# Patient Record
Sex: Female | Born: 1980 | Race: White | Hispanic: No | Marital: Married | Smoking: Former smoker
Health system: Southern US, Community
[De-identification: ages and names within clinical notes are randomized; demographics above are authoritative.]

## PROBLEM LIST (undated history)

## (undated) DIAGNOSIS — G4730 Sleep apnea, unspecified: Secondary | ICD-10-CM

## (undated) DIAGNOSIS — I10 Essential (primary) hypertension: Secondary | ICD-10-CM

## (undated) DIAGNOSIS — Z9889 Other specified postprocedural states: Secondary | ICD-10-CM

## (undated) DIAGNOSIS — R112 Nausea with vomiting, unspecified: Secondary | ICD-10-CM

## (undated) HISTORY — DX: Nausea with vomiting, unspecified: R11.2

## (undated) HISTORY — DX: Other specified postprocedural states: Z98.890

## (undated) HISTORY — PX: NO PAST SURGERIES: SHX2092

---
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
## 2001-08-24 ENCOUNTER — Emergency Department (HOSPITAL_COMMUNITY): Admission: EM | Admit: 2001-08-24 | Discharge: 2001-08-24 | Payer: Self-pay | Admitting: Emergency Medicine

## 2004-01-02 ENCOUNTER — Inpatient Hospital Stay (HOSPITAL_COMMUNITY): Admission: AD | Admit: 2004-01-02 | Discharge: 2004-01-02 | Payer: Self-pay | Admitting: Family Medicine

## 2009-05-21 ENCOUNTER — Emergency Department (HOSPITAL_COMMUNITY): Admission: EM | Admit: 2009-05-21 | Discharge: 2009-05-21 | Payer: Self-pay | Admitting: Emergency Medicine

## 2009-06-15 ENCOUNTER — Emergency Department (HOSPITAL_COMMUNITY): Admission: EM | Admit: 2009-06-15 | Discharge: 2009-06-15 | Payer: Self-pay | Admitting: Emergency Medicine

## 2009-10-25 ENCOUNTER — Emergency Department (HOSPITAL_COMMUNITY): Admission: EM | Admit: 2009-10-25 | Discharge: 2009-10-26 | Payer: Self-pay | Admitting: Emergency Medicine

## 2011-02-26 ENCOUNTER — Other Ambulatory Visit (HOSPITAL_COMMUNITY): Payer: Self-pay | Admitting: Interventional Radiology

## 2011-02-26 ENCOUNTER — Other Ambulatory Visit (HOSPITAL_COMMUNITY): Payer: Self-pay | Admitting: Internal Medicine

## 2011-09-13 ENCOUNTER — Inpatient Hospital Stay (INDEPENDENT_AMBULATORY_CARE_PROVIDER_SITE_OTHER)
Admission: RE | Admit: 2011-09-13 | Discharge: 2011-09-13 | Disposition: A | Discharge status: Home / Self Care | Payer: Self-pay | Source: Ambulatory Visit | Attending: Family Medicine | Admitting: Family Medicine

## 2011-09-13 DIAGNOSIS — R071 Chest pain on breathing: Secondary | ICD-10-CM

## 2014-04-22 ENCOUNTER — Encounter (HOSPITAL_COMMUNITY): Payer: Self-pay | Admitting: Emergency Medicine

## 2014-04-22 ENCOUNTER — Emergency Department (INDEPENDENT_AMBULATORY_CARE_PROVIDER_SITE_OTHER)
Admission: EM | Admit: 2014-04-22 | Discharge: 2014-04-22 | Disposition: A | Discharge status: Home / Self Care | Payer: Self-pay | Source: Home / Self Care | Attending: Family Medicine | Admitting: Family Medicine

## 2014-04-22 DIAGNOSIS — J309 Allergic rhinitis, unspecified: Secondary | ICD-10-CM

## 2014-04-22 DIAGNOSIS — R030 Elevated blood-pressure reading, without diagnosis of hypertension: Secondary | ICD-10-CM

## 2014-04-22 DIAGNOSIS — IMO0001 Reserved for inherently not codable concepts without codable children: Secondary | ICD-10-CM

## 2014-04-22 DIAGNOSIS — J301 Allergic rhinitis due to pollen: Secondary | ICD-10-CM

## 2014-04-22 MED ORDER — LORATADINE 10 MG PO TABS: 10.0000 mg | ORAL_TABLET | Freq: Every day | ORAL | Status: DC

## 2014-04-22 MED ORDER — IPRATROPIUM BROMIDE 0.06 % NA SOLN: 2.0000 | Freq: Four times a day (QID) | NASAL | Status: DC

## 2014-04-22 NOTE — ED Notes (Signed)
Pt reports  Symptoms  Of   sorethroat  Nasal  Congestion   /  Cough        As  Well  As  Headache     With  Symptoms  X    3  Days  -  She  Is  Sitting upright on  The  Exam table  Speaking in  Complete  sentances  In  No  Severe  Distress

## 2014-04-22 NOTE — ED Provider Notes (Signed)
CSN: [id number]     Arrival date & time 04/22/14  1512 History   First MD Initiated Contact with Patient 04/22/14 1617     Chief Complaint  Patient presents with  . URI   (Consider location/radiation/quality/duration/timing/severity/associated sxs/prior Treatment) HPI Comments: Nasal congestion x 3 weeks. Has been using Sinex nasal spray and states each time she stop using spray, nose becomes congested again. Also reports 3-4 days of cough and ear congestion without associated fever.  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   Patient is a 33 y.o. female presenting with URI.  URI Presenting symptoms: congestion, cough and rhinorrhea   Presenting symptoms: no ear pain and no sore throat   Associated symptoms: sneezing     History reviewed. No pertinent past medical history. History reviewed. No pertinent past surgical history. History reviewed. No pertinent family history. History  Substance Use Topics  . Smoking status: Current Every Day Smoker  . Smokeless tobacco: Not on file  . Alcohol Use: Yes   OB History   Grav Para Term Preterm Abortions TAB SAB Ect Mult Living                 Review of Systems  Constitutional: Negative.   HENT: Positive for congestion, postnasal drip, rhinorrhea, sinus pressure and sneezing. Negative for ear discharge, ear pain, mouth sores, nosebleeds, sore throat and trouble swallowing.   Eyes: Negative.   Respiratory: Positive for cough. Negative for chest tightness and shortness of breath.   Cardiovascular: Negative.   Gastrointestinal: Negative.   Genitourinary: Negative.   Musculoskeletal: Negative.   Skin: Negative.     Allergies  Review of patient's allergies indicates no known allergies.  Home Medications   Prior to Admission medications   Not on File   BP 122/103  Pulse 70  Temp(Src) 99.1 F (37.3 C) (Oral)  Resp 18  SpO2 100%  LMP 04/15/2014 Physical Exam  Nursing note and vitals  reviewed. Constitutional: She is oriented to person, place, and time. She appears well-developed and well-nourished. No distress.  HENT:  Head: Normocephalic and atraumatic.  Right Ear: Hearing, external ear and ear canal normal. A middle ear effusion is present.  Left Ear: Hearing, external ear and ear canal normal. A middle ear effusion is present.  Nose: Mucosal edema present.  Mouth/Throat: Uvula is midline, oropharynx is clear and moist and mucous membranes are normal. No oral lesions. No trismus in the jaw. No uvula swelling.  Trace of clear fluid behind each TM  Eyes: Conjunctivae are normal. Right eye exhibits no discharge. Left eye exhibits no discharge. No scleral icterus.  Neck: Normal range of motion. Neck supple.  Cardiovascular: Normal rate, regular rhythm and normal heart sounds.   Pulmonary/Chest: Effort normal and breath sounds normal. No respiratory distress. She has no wheezes.  Musculoskeletal: Normal range of motion.  Neurological: She is alert and oriented to person, place, and time.  Skin: Skin is warm and dry.  Psychiatric: She has a normal mood and affect. Her behavior is normal.    ED Course  Procedures (including critical care time) Labs Review Labs Reviewed - No data to display  Imaging Review No results found.   MDM   1. Elevated blood pressure   2. Hayfever    Will treat with Atrovent nasal spray and Claritin. Will advise patient to discontinue Sinex nasal spray. She will also need to locate PCP for blood pressure re-check in 7-10 days. Advised to discontinue smoking. Delsym  as prescribed on packaging for cough.    Jess BartersJennifer Lee Morgan[place] Resort[place], [place] 04/22/14 1655

## 2014-04-22 NOTE — Discharge Instructions (Signed)
Please discontinue using the Sinex nasal spray as it may be causing your blood pressure to be elevated. Use medications as prescribed and [facility].  Please try to stop smoking. Hay Fever Hay fever is an allergic reaction to particles in the air. It cannot be passed from person to person. It cannot be cured, but it can be controlled. CAUSES  Hay fever is caused by something that triggers an allergic reaction (allergens). The following are examples of allergens:  Ragweed.  Feathers.  Animal dander.  Grass and tree pollens.  Cigarette smoke.  House dust.  Pollution. SYMPTOMS   Sneezing.  Runny or stuffy nose.  Tearing eyes.  Itchy eyes, nose, mouth, throat, skin, or other area.  Sore throat.  Headache.  Decreased sense of smell or taste. DIAGNOSIS Your caregiver will perform a physical exam and ask questions about the symptoms you are having.Allergy testing may be done to determine exactly what triggers your hay fever.  TREATMENT   Over-the-counter medicines may help symptoms. These include:  Antihistamines.  Decongestants. These may help with nasal congestion.  Your caregiver may prescribe medicines if over-the-counter medicines do not work.  Some people benefit from allergy shots when other medicines are not helpful. HOME CARE INSTRUCTIONS   Avoid the allergen that is causing your symptoms, if possible.  Take all medicine as told by your caregiver. SEEK MEDICAL CARE IF:   You have severe allergy symptoms and your current medicines are not helping.  Your treatment was working at one time, but you are now experiencing symptoms.  You have sinus congestion and pressure.  You develop a fever or headache.  You have thick nasal discharge.  You have asthma and have a worsening cough and wheezing. SEEK IMMEDIATE MEDICAL CARE IF:   You have swelling of your tongue or lips.  You have trouble  breathing.  You feel lightheaded or like you are going to faint.  You have cold sweats.  You have a fever. Document Released: 11/26/2005 Document Revised: 02/18/2012 Document Reviewed: 02/21/2011 [facility] Patient Information 2014 [place], [place].  Hypertension As your heart beats, it forces blood through your arteries. This force is your blood pressure. If the pressure is too high, it is called hypertension (HTN) or high blood pressure. HTN is dangerous because you may have it and not know it. High blood pressure may mean that your heart has to work harder to pump blood. Your arteries may be narrow or stiff. The extra work puts you at risk for heart disease, stroke, and other problems.  Blood pressure consists of two numbers, a higher number over a lower, 110/72, for example. It is stated as "110 over 72." The ideal is below 120 for the top number (systolic) and under 80 for the bottom (diastolic). Write down your blood pressure today. You should pay close attention to your blood pressure if you have certain conditions such as:  Heart failure.  Prior heart attack.  Diabetes  Chronic kidney disease.  Prior stroke.  Multiple risk factors for heart disease. To see if you have HTN, your blood pressure should be measured while you are seated with your arm held at the level of the heart. It should be measured at least twice. A one-time elevated blood pressure reading (especially in the Emergency Department) does not mean that you need treatment. There may be conditions in which the blood pressure is different between your right and left arms. It is important  to see your caregiver soon for a recheck. Most people have essential hypertension which means that there is not a specific cause. This type of high blood pressure may be lowered by changing lifestyle factors such as:  Stress.  Smoking.  Lack of exercise.  Excessive weight.  Drug/tobacco/alcohol use.  Eating less salt. Most  people do not have symptoms from high blood pressure until it has caused damage to the body. Effective treatment can often prevent, delay or reduce that damage. TREATMENT  When a cause has been identified, treatment for high blood pressure is directed at the cause. There are a large number of medications to treat HTN. These fall into several categories, and your caregiver will help you select the medicines that are best for you. Medications may have side effects. You should review side effects with your caregiver. If your blood pressure stays high after you have made lifestyle changes or started on medicines,   Your medication(s) may need to be changed.  Other problems may need to be addressed.  Be certain you understand your prescriptions, and know how and when to take your medicine.  Be sure to follow up with your caregiver within the time frame advised (usually within two weeks) to have your blood pressure rechecked and to review your medications.  If you are taking more than one medicine to lower your blood pressure, make sure you know how and at what times they should be taken. Taking two medicines at the same time can result in blood pressure that is too low. SEEK IMMEDIATE MEDICAL CARE IF:  You develop a severe headache, blurred or changing vision, or confusion.  You have unusual weakness or numbness, or a faint feeling.  You have severe chest or abdominal pain, vomiting, or breathing problems. MAKE SURE YOU:   Understand these instructions.  Will watch your condition.  Will get help right away if you are not doing well or get worse. Document Released: 11/26/2005 Document Revised: 02/18/2012 Document Reviewed: 07/16/2008 [facility] Patient Information 2014 [place], [place].

## 2014-04-25 NOTE — ED Provider Notes (Signed)
Medical screening examination/treatment/procedure(s) were performed by a resident physician or non-physician practitioner and as the supervising physician I was immediately available for consultation/collaboration.  Destane Speas, MD    Ariq Khamis S Juandavid Dallman, MD 04/25/14 0844 

## 2015-12-11 NOTE — L&D Delivery Note (Signed)
Delivery Note Pt became complete at 1924 and pushed approx 20 mins and at 7:53 PM a viable female was delivered via Vaginal, Spontaneous Delivery (Presentation: ROA).  APGAR: 9, 9; weight: pending.  Cord clamped and cut by FOB after 60sec. Hospital cord blood sample collected. Placenta status: spont,intact.  Cord: 3 vessel  Anesthesia:  epidural Episiotomy: None Lacerations: None Est. Blood Loss (mL): 150  Mom to postpartum.  Baby to Couplet care / Skin to Skin.  Cam HaiSHAW, KIMBERLY CNM 11/12/2016, 8:57 PM

## 2016-05-03 ENCOUNTER — Encounter: Payer: Self-pay | Admitting: Certified Nurse Midwife

## 2016-05-03 ENCOUNTER — Ambulatory Visit (INDEPENDENT_AMBULATORY_CARE_PROVIDER_SITE_OTHER): Payer: Medicaid Other | Admitting: Certified Nurse Midwife

## 2016-05-03 VITALS — BP 134/85 | HR 57 | Wt 165.0 lb

## 2016-05-03 DIAGNOSIS — Z3401 Encounter for supervision of normal first pregnancy, first trimester: Secondary | ICD-10-CM

## 2016-05-03 DIAGNOSIS — O219 Vomiting of pregnancy, unspecified: Secondary | ICD-10-CM

## 2016-05-03 DIAGNOSIS — Z1389 Encounter for screening for other disorder: Secondary | ICD-10-CM

## 2016-05-03 DIAGNOSIS — Z331 Pregnant state, incidental: Secondary | ICD-10-CM

## 2016-05-03 DIAGNOSIS — Z3687 Encounter for antenatal screening for uncertain dates: Secondary | ICD-10-CM

## 2016-05-03 DIAGNOSIS — Z3492 Encounter for supervision of normal pregnancy, unspecified, second trimester: Secondary | ICD-10-CM | POA: Diagnosis not present

## 2016-05-03 DIAGNOSIS — Z3481 Encounter for supervision of other normal pregnancy, first trimester: Secondary | ICD-10-CM

## 2016-05-03 DIAGNOSIS — O0990 Supervision of high risk pregnancy, unspecified, unspecified trimester: Secondary | ICD-10-CM | POA: Insufficient documentation

## 2016-05-03 LAB — POCT URINALYSIS DIPSTICK
Bilirubin, UA: NEGATIVE
Blood, UA: NEGATIVE
Clarity, UA: NEGATIVE
Glucose, UA: NEGATIVE
Ketones, UA: NEGATIVE
Leukocytes, UA: NEGATIVE
Nitrite, UA: NEGATIVE
Specific Gravity, UA: 1.015
Urobilinogen, UA: NEGATIVE
pH, UA: 5

## 2016-05-03 MED ORDER — VITAFOL GUMMIES 3.33-0.333-34.8 MG PO CHEW: 3.0000 | CHEWABLE_TABLET | Freq: Every day | ORAL | Status: DC

## 2016-05-03 MED ORDER — DOXYLAMINE-PYRIDOXINE 10-10 MG PO TBEC: DELAYED_RELEASE_TABLET | ORAL | Status: DC

## 2016-05-03 NOTE — Progress Notes (Signed)
Subjective:    Shelley Rojas is being seen today for her first obstetrical visit.  This is not a planned pregnancy. She is at [redacted]w[redacted]d gestation. Her obstetrical history is significant for smoker and 6-7 cig/day. Relationship with FOB: spouse, living together. Patient does not intend to breast feed. Pregnancy history fully reviewed.  The information documented in the HPI was reviewed and verified.  Menstrual History: OB History    Gravida Para Term Preterm AB TAB SAB Ectopic Multiple Living   1               Menarche age: 35 years of age.    Patient's last menstrual period was 02/17/2016 (approximate).    No past medical history on file.  No past surgical history on file.   (Not in a hospital admission) No Known Allergies  Social History  Substance Use Topics  . Smoking status: Current Every Day Smoker  . Smokeless tobacco: Not on file  . Alcohol Use: Yes    No family history on file.   Review of Systems Constitutional: negative for weight loss Gastrointestinal: + for nausea & vomiting Genitourinary:negative for genital lesions and vaginal discharge and dysuria Musculoskeletal:negative for back pain Behavioral/Psych: negative for abusive relationship, depression, illegal drug usage and tobacco use    Objective:    BP 134/85 mmHg  Pulse 57  Wt 165 lb (74.844 kg)  LMP 02/17/2016 (Approximate) General Appearance:    Alert, cooperative, no distress, appears stated age  Head:    Normocephalic, without obvious abnormality, atraumatic  Eyes:    PERRL, conjunctiva/corneas clear, EOM's intact, fundi    benign, both eyes  Ears:    Normal TM's and external ear canals, both ears  Nose:   Nares normal, septum midline, mucosa normal, no drainage    or sinus tenderness  Throat:   Lips, mucosa, and tongue normal; teeth and gums normal  Neck:   Supple, symmetrical, trachea midline, no adenopathy;    thyroid:  no enlargement/tenderness/nodules; no carotid   bruit or JVD  Back:      Symmetric, no curvature, ROM normal, no CVA tenderness  Lungs:     Clear to auscultation bilaterally, respirations unlabored  Chest Wall:    No tenderness or deformity   Heart:    Regular rate and rhythm, S1 and S2 normal, no murmur, rub   or gallop  Breast Exam:    No tenderness, masses, or nipple abnormality  Abdomen:     Soft, non-tender, bowel sounds active all four quadrants,    no masses, no organomegaly  Genitalia:    Normal female without lesion, discharge or tenderness  Extremities:   Extremities normal, atraumatic, no cyanosis or edema  Pulses:   2+ and symmetric all extremities  Skin:   Skin color, texture, turgor normal, no rashes or lesions  Lymph nodes:   Cervical, supraclavicular, and axillary nodes normal  Neurologic:   CNII-XII intact, normal strength, sensation and reflexes    throughout      Lab Review Urine pregnancy test Labs reviewed yes Radiologic studies reviewed no Assessment:    Pregnancy at [redacted]w[redacted]d weeks   N&V in early pregnancy   Unsure of LMP  Plan:      Prenatal vitamins.  Counseling provided regarding continued use of seat belts, cessation of alcohol consumption, smoking or use of illicit drugs; infection precautions i.e., influenza/TDAP immunizations, toxoplasmosis,CMV, parvovirus, listeria and varicella; workplace safety, exercise during pregnancy; routine dental care, safe medications, sexual activity, hot tubs, saunas,  pools, travel, caffeine use, fish and methlymercury, potential toxins, hair treatments, varicose veins Weight gain recommendations per IOM guidelines reviewed: underweight/BMI< 18.5--> gain 28 - 40 lbs; normal weight/BMI 18.5 - 24.9--> gain 25 - 35 lbs; overweight/BMI 25 - 29.9--> gain 15 - 25 lbs; obese/BMI >30->gain  11 - 20 lbs Problem list reviewed and updated. FIRST/CF mutation testing/NIPT/QUAD SCREEN/fragile X/Ashkenazi Jewish population testing/Spinal muscular atrophy discussed: ordered. Role of ultrasound in pregnancy  discussed; fetal survey: requested. Amniocentesis discussed: not indicated. VBAC calculator score: VBAC consent form provided Meds ordered this encounter  Medications  . Doxylamine-Pyridoxine (DICLEGIS) 10-10 MG TBEC    Sig: Take 1 tablet with breakfast and lunch.  Take 2 tablets at bedtime.    Dispense:  100 tablet    Refill:  4  . Prenatal Vit-Fe Phos-FA-Omega (VITAFOL GUMMIES) 3.33-0.333-34.8 MG CHEW    Sig: Chew 3 tablets by mouth daily.    Dispense:  90 tablet    Refill:  12   Orders Placed This Encounter  Procedures  . Culture, OB Urine  . US OB Comp Less 14 Wks    Standing Status: Future     Number of Occurrences:      Standing Expiration Date: 07/03/2017    Order Specific Question:  Reason for Exam (SYMPTOM  OR DIAGNOSIS REQUIRED)    Answer:  unsure of lmp, viability    Order Specific Question:  Preferred imaging location?    Answer:  Internal  . HIV antibody  . Hemoglobinopathy evaluation  . Varicella zoster antibody, IgG  . VITAMIN D 25 Hydroxy (Vit-D Deficiency, Fractures)  . Prenatal Profile I  . MaterniT21 PLUS Core+SCA    Order Specific Question:  Is the patient insulin dependent?    Answer:  No    Order Specific Question:  Please enter gestational age. This should be expressed as weeks AND days, i.e. 16w 6d. Enter weeks here. Enter days in next question.    Answer:  6110    Order Specific Question:  Please enter gestational age. This should be expressed as weeks AND days, i.e. 16w 6d. Enter days here. Enter weeks in previous question.    Answer:  6    Order Specific Question:  How was gestational age calculated?    Answer:  LMP    Order Specific Question:  Please give the date of LMP OR Ultrasound OR Estimated date of delivery.    Answer:  02/17/2016    Order Specific Question:  Number of Fetuses (Type of Pregnancy):    Answer:  1    Order Specific Question:  Indications for performing the test? (please choose all that apply):    Answer:  Routine screening     Order Specific Question:  Other Indications? (Y=Yes, N=No)    Answer:  N    Order Specific Question:  If this is a repeat specimen, please indicate the reason:    Answer:  Not indicated    Order Specific Question:  Please specify the patient's race: (C=White/Caucasion, B=Black, I=Native American, A=Asian, H=Hispanic, O=Other, U=Unknown)    Answer:  U    Order Specific Question:  Donor Egg - indicate if the egg was obtained from in vitro fertilization.    Answer:  N    Order Specific Question:  Age of Egg Donor.    Answer:  0    Order Specific Question:  Prior Down Syndrome/ONTD screening during current pregnancy.    Answer:  N    Order Specific Question:  Prior First  Trimester Testing    Answer:  N    Order Specific Question:  Prior Second Trimester Testing    Answer:  N    Order Specific Question:  Family History of Neural Tube Defects    Answer:  N    Order Specific Question:  Prior Pregnancy with Down Syndrome    Answer:  N    Order Specific Question:  Please give the patient's weight (in pounds)    Answer:  165  . POCT Urinalysis Dipstick    Follow up in 4 weeks. 50% of 30 min visit spent on counseling and coordination of care.

## 2016-05-07 LAB — NUSWAB VG+, CANDIDA 6SP
ATOPOBIUM VAGINAE: HIGH {score} — AB
BVAB 2: HIGH Score — AB
CANDIDA ALBICANS, NAA: NEGATIVE
CANDIDA GLABRATA, NAA: NEGATIVE
CANDIDA LUSITANIAE, NAA: NEGATIVE
CANDIDA PARAPSILOSIS, NAA: NEGATIVE
CHLAMYDIA TRACHOMATIS, NAA: NEGATIVE
Candida krusei, NAA: NEGATIVE
Candida tropicalis, NAA: NEGATIVE
Megasphaera 1: HIGH Score — AB
NEISSERIA GONORRHOEAE, NAA: NEGATIVE
Trich vag by NAA: NEGATIVE

## 2016-05-08 ENCOUNTER — Other Ambulatory Visit: Payer: Self-pay | Admitting: Certified Nurse Midwife

## 2016-05-08 DIAGNOSIS — B9689 Other specified bacterial agents as the cause of diseases classified elsewhere: Secondary | ICD-10-CM

## 2016-05-08 DIAGNOSIS — N760 Acute vaginitis: Principal | ICD-10-CM

## 2016-05-08 MED ORDER — METRONIDAZOLE 0.75 % VA GEL: 1.0000 | Freq: Two times a day (BID) | VAGINAL | Status: DC

## 2016-05-09 ENCOUNTER — Other Ambulatory Visit: Payer: Self-pay | Admitting: Certified Nurse Midwife

## 2016-05-09 LAB — PAP IG AND HPV HIGH-RISK
HPV, high-risk: NEGATIVE
PAP Smear Comment: 0

## 2016-05-09 LAB — CULTURE, OB URINE

## 2016-05-09 LAB — URINE CULTURE, OB REFLEX

## 2016-05-10 ENCOUNTER — Other Ambulatory Visit: Payer: Self-pay | Admitting: Certified Nurse Midwife

## 2016-05-10 ENCOUNTER — Encounter: Payer: Self-pay | Admitting: *Deleted

## 2016-05-10 ENCOUNTER — Ambulatory Visit (INDEPENDENT_AMBULATORY_CARE_PROVIDER_SITE_OTHER): Payer: Medicaid Other

## 2016-05-10 DIAGNOSIS — T7840XS Allergy, unspecified, sequela: Secondary | ICD-10-CM

## 2016-05-10 DIAGNOSIS — O3680X1 Pregnancy with inconclusive fetal viability, fetus 1: Secondary | ICD-10-CM | POA: Diagnosis not present

## 2016-05-10 DIAGNOSIS — Z3687 Encounter for antenatal screening for uncertain dates: Secondary | ICD-10-CM

## 2016-05-10 LAB — MATERNIT21 PLUS CORE+SCA
CHROMOSOME 13: NEGATIVE
CHROMOSOME 21: NEGATIVE
Chromosome 18: NEGATIVE
PDF: 0
Y Chromosome: NOT DETECTED

## 2016-05-10 LAB — PRENATAL PROFILE I(LABCORP)
ANTIBODY SCREEN: NEGATIVE
BASOS: 0 %
Basophils Absolute: 0 10*3/uL (ref 0.0–0.2)
EOS (ABSOLUTE): 0.1 10*3/uL (ref 0.0–0.4)
Eos: 1 %
HEMATOCRIT: 40.3 % (ref 34.0–46.6)
HEMOGLOBIN: 13.6 g/dL (ref 11.1–15.9)
Hepatitis B Surface Ag: NEGATIVE
Immature Grans (Abs): 0 10*3/uL (ref 0.0–0.1)
Immature Granulocytes: 0 %
LYMPHS ABS: 1 10*3/uL (ref 0.7–3.1)
Lymphs: 19 %
MCH: 30.4 pg (ref 26.6–33.0)
MCHC: 33.7 g/dL (ref 31.5–35.7)
MCV: 90 fL (ref 79–97)
MONOS ABS: 0.3 10*3/uL (ref 0.1–0.9)
Monocytes: 6 %
NEUTROS ABS: 4 10*3/uL (ref 1.4–7.0)
Neutrophils: 74 %
Platelets: 215 10*3/uL (ref 150–379)
RBC: 4.47 x10E6/uL (ref 3.77–5.28)
RDW: 14.1 % (ref 12.3–15.4)
RPR: NONREACTIVE
Rh Factor: POSITIVE
WBC: 5.4 10*3/uL (ref 3.4–10.8)

## 2016-05-10 LAB — HEMOGLOBINOPATHY EVALUATION
HEMOGLOBIN F QUANTITATION: 0 % (ref 0.0–2.0)
HGB A: 97.7 % (ref 94.0–98.0)
HGB C: 0 %
HGB S: 0 %
Hemoglobin A2 Quantitation: 2.3 % (ref 0.7–3.1)

## 2016-05-10 LAB — HIV ANTIBODY (ROUTINE TESTING W REFLEX): HIV SCREEN 4TH GENERATION: NONREACTIVE

## 2016-05-10 LAB — VITAMIN D 25 HYDROXY (VIT D DEFICIENCY, FRACTURES): Vit D, 25-Hydroxy: 23.5 ng/mL — ABNORMAL LOW (ref 30.0–100.0)

## 2016-05-10 LAB — VARICELLA ZOSTER ANTIBODY, IGG: Varicella zoster IgG: 1013 index (ref >165)

## 2016-05-10 MED ORDER — LORATADINE 10 MG PO TABS: 10.0000 mg | ORAL_TABLET | Freq: Every day | ORAL | Status: DC

## 2016-05-10 MED ORDER — FLUTICASONE PROPIONATE 50 MCG/ACT NA SUSP: 2.0000 | Freq: Every day | NASAL | Status: DC

## 2016-06-01 ENCOUNTER — Encounter: Payer: Medicaid Other | Admitting: Certified Nurse Midwife

## 2016-06-06 ENCOUNTER — Ambulatory Visit (INDEPENDENT_AMBULATORY_CARE_PROVIDER_SITE_OTHER): Payer: Medicaid Other | Admitting: Certified Nurse Midwife

## 2016-06-06 ENCOUNTER — Encounter: Payer: Self-pay | Admitting: Certified Nurse Midwife

## 2016-06-06 VITALS — BP 135/93 | HR 81 | Wt 166.0 lb

## 2016-06-06 DIAGNOSIS — O132 Gestational [pregnancy-induced] hypertension without significant proteinuria, second trimester: Secondary | ICD-10-CM

## 2016-06-06 DIAGNOSIS — Z331 Pregnant state, incidental: Secondary | ICD-10-CM | POA: Diagnosis not present

## 2016-06-06 DIAGNOSIS — Z1389 Encounter for screening for other disorder: Secondary | ICD-10-CM | POA: Diagnosis not present

## 2016-06-06 DIAGNOSIS — Z3482 Encounter for supervision of other normal pregnancy, second trimester: Secondary | ICD-10-CM | POA: Diagnosis not present

## 2016-06-06 DIAGNOSIS — O162 Unspecified maternal hypertension, second trimester: Secondary | ICD-10-CM

## 2016-06-06 LAB — POCT URINALYSIS DIPSTICK
BILIRUBIN UA: NEGATIVE
GLUCOSE UA: NEGATIVE
KETONES UA: NEGATIVE
Leukocytes, UA: NEGATIVE
Nitrite, UA: NEGATIVE
Protein, UA: NEGATIVE
RBC UA: NEGATIVE
UROBILINOGEN UA: NEGATIVE
pH, UA: 6.5

## 2016-06-06 MED ORDER — LABETALOL HCL 200 MG PO TABS: 200.0000 mg | ORAL_TABLET | Freq: Two times a day (BID) | ORAL | Status: DC

## 2016-06-06 MED ORDER — ASPIRIN 81 MG PO CHEW: 81.0000 mg | CHEWABLE_TABLET | Freq: Every day | ORAL | Status: DC

## 2016-06-06 NOTE — Progress Notes (Signed)
  Subjective:    Shelley Rojas is a 35 y.o. female being seen today for her obstetrical visit. She is at 7141w5d gestation. Patient reports: nausea, no bleeding, no contractions, no cramping, no leaking and vomiting.  Problem List Items Addressed This Visit    None     Patient Active Problem List   Diagnosis Date Noted  . Supervision of normal first pregnancy in first trimester 05/03/2016    Objective:     BP 135/93 mmHg  Pulse 81  Wt 166 lb (75.297 kg)  LMP 02/17/2016 (Approximate) Uterine Size: Below umbilicus   FHR: 150 by doppler  Assessment:    Pregnancy @ 7141w5d  weeks N&V in early pregnancy     Chronic HTN Plan:    Problem list reviewed and updated. Labs reviewed.  Follow up in 4 weeks. FIRST/CF mutation testing/NIPT/QUAD SCREEN/fragile X/Ashkenazi Jewish population testing/Spinal muscular atrophy discussed: results reviewed. Role of ultrasound in pregnancy discussed; fetal survey: ordered. Amniocentesis discussed: not indicated. 50% of 25 minute visit spent on counseling and coordination of care.

## 2016-06-07 LAB — CREATININE, SERUM
CREATININE: 0.61 mg/dL (ref 0.57–1.00)
GFR calc Af Amer: 138 mL/min/{1.73_m2} (ref >59)
GFR, EST NON AFRICAN AMERICAN: 119 mL/min/{1.73_m2} (ref >59)

## 2016-06-07 LAB — PROTEIN / CREATININE RATIO, URINE
CREATININE, UR: 13.1 mg/dL
PROTEIN UR: 4.6 mg/dL
Protein/Creat Ratio: 351 mg/g creat — ABNORMAL HIGH (ref 0–200)

## 2016-06-07 LAB — CBC
HEMOGLOBIN: 12.2 g/dL (ref 11.1–15.9)
Hematocrit: 34.8 % (ref 34.0–46.6)
MCH: 31.3 pg (ref 26.6–33.0)
MCHC: 35.1 g/dL (ref 31.5–35.7)
MCV: 89 fL (ref 79–97)
PLATELETS: 192 10*3/uL (ref 150–379)
RBC: 3.9 x10E6/uL (ref 3.77–5.28)
RDW: 14 % (ref 12.3–15.4)
WBC: 6.6 10*3/uL (ref 3.4–10.8)

## 2016-06-07 LAB — LACTATE DEHYDROGENASE: LDH: 143 IU/L (ref 119–226)

## 2016-06-07 LAB — AST: AST: 13 IU/L (ref 0–40)

## 2016-06-07 LAB — ALT: ALT: 10 IU/L (ref 0–32)

## 2016-06-13 LAB — PATHOLOGIST SMEAR REVIEW
PATH REV PLTS: NORMAL
Path Rev RBC: NORMAL
Path Rev WBC: NORMAL

## 2016-06-29 ENCOUNTER — Encounter (HOSPITAL_COMMUNITY): Payer: Self-pay | Admitting: Certified Nurse Midwife

## 2016-07-06 ENCOUNTER — Other Ambulatory Visit: Payer: Self-pay | Admitting: Certified Nurse Midwife

## 2016-07-06 ENCOUNTER — Ambulatory Visit (INDEPENDENT_AMBULATORY_CARE_PROVIDER_SITE_OTHER): Payer: Medicaid Other | Admitting: Certified Nurse Midwife

## 2016-07-06 ENCOUNTER — Encounter (HOSPITAL_COMMUNITY): Payer: Self-pay

## 2016-07-06 ENCOUNTER — Ambulatory Visit (HOSPITAL_COMMUNITY)
Admission: RE | Admit: 2016-07-06 | Discharge: 2016-07-06 | Disposition: A | Discharge status: Home / Self Care | Payer: Medicaid Other | Source: Ambulatory Visit | Attending: Certified Nurse Midwife | Admitting: Certified Nurse Midwife

## 2016-07-06 VITALS — BP 123/87 | HR 76 | Wt 170.0 lb

## 2016-07-06 DIAGNOSIS — Z36 Encounter for antenatal screening of mother: Secondary | ICD-10-CM | POA: Diagnosis not present

## 2016-07-06 DIAGNOSIS — Z331 Pregnant state, incidental: Secondary | ICD-10-CM | POA: Diagnosis not present

## 2016-07-06 DIAGNOSIS — Z1389 Encounter for screening for other disorder: Secondary | ICD-10-CM | POA: Diagnosis not present

## 2016-07-06 DIAGNOSIS — O0992 Supervision of high risk pregnancy, unspecified, second trimester: Secondary | ICD-10-CM

## 2016-07-06 DIAGNOSIS — Z3A20 20 weeks gestation of pregnancy: Secondary | ICD-10-CM | POA: Insufficient documentation

## 2016-07-06 DIAGNOSIS — O162 Unspecified maternal hypertension, second trimester: Secondary | ICD-10-CM

## 2016-07-06 DIAGNOSIS — O10012 Pre-existing essential hypertension complicating pregnancy, second trimester: Secondary | ICD-10-CM | POA: Diagnosis present

## 2016-07-06 DIAGNOSIS — Z3689 Encounter for other specified antenatal screening: Secondary | ICD-10-CM

## 2016-07-06 DIAGNOSIS — Z3482 Encounter for supervision of other normal pregnancy, second trimester: Secondary | ICD-10-CM

## 2016-07-06 DIAGNOSIS — O10912 Unspecified pre-existing hypertension complicating pregnancy, second trimester: Secondary | ICD-10-CM

## 2016-07-06 LAB — POCT URINALYSIS DIPSTICK
BILIRUBIN UA: NEGATIVE
Blood, UA: NEGATIVE
GLUCOSE UA: NEGATIVE
Ketones, UA: NEGATIVE
Leukocytes, UA: NEGATIVE
NITRITE UA: NEGATIVE
Protein, UA: NEGATIVE
Spec Grav, UA: <=1.005
UROBILINOGEN UA: NEGATIVE
pH, UA: 7

## 2016-07-06 NOTE — Progress Notes (Signed)
MFM consult, staff note  During our discussion, I reviewed hypertension as a cause of uteroplacental insufficiency, with increased risk of IUGR, oligohydramnios, and stillbirth. I told her that her hypertension also places her at increased risk for preeclampsia, describing the triad of increased blood pressure, proteinuria, and abnormal edema. Lastly, hypertension (severe range) increases the risk of placental abruption, especially in the setting of superimposed preeclampsia.  I reviewed the essential tenets in the most recent guidelines for management of hypertension in pregnancy in accordance with the American College of Obstetrics and Gynecology expert opinion. We talked about the medical treatment of hypertension in pregnancy. I outlined the different classes of medications, emphasizing that angiotensin enzyme inhibitors and angoitensin receptor blockers are contraindicated, and diuretics are relatively contraindicated. I told her that beta-blockers and calcium channel blockers are commonly used to treat hypertension in pregnancy, and that both are felt to be safe for use in pregnancy.   Her dose of labetalol is adequate in current maintainance of her blood pressure in pregnancy as evidenced by normotensive BP today.   I outlined the usual plan of management for hypertension in pregnancy. She should have her blood pressure carefully followed, and her medications adjusted to keep her BP in the target range of around 130-159/70-109 mm/Hg; ie, HTN should not be treated (no medication adjustment) until 160/110 or greater measurements for blood pressures to be consistent with current ACOG/SMFM guidelines (ie, guidelines are 160/110 in absence of renal/cardiac disease during pregnancy).   Recommendations:  1. Interval growth ultrasounds monthly 2. Antenatal testing to begin at 32 weeks 3. Initiate aspirin 81 mg po qd and continue until 36 weeks to prevent preeclampsia/IUGR in setting of CHTN 4. Delivery  at 38-39 weeks.  Time Spent: I spent in excess of 30 minutes in consultation with this patient to review records, evaluate her case, and provide her with an adequate discussion and education.  More than 50% of this time was spent in direct face-to-face counseling and evaluation.  It was a pleasure seeing your patient in the office today.  Thank you for consultation. Please do not hesitate to contact our service for any further questions.   Thank you,  Louann Sjogren Gaynelle Arabian, Louann Sjogren, MD, MS, FACOG Assistant Professor Section of Maternal-Fetal Medicine [facility]

## 2016-07-06 NOTE — Progress Notes (Signed)
Subjective:    Shelley Rojas is a 35 y.o. female being seen today for her obstetrical visit. She is at [redacted]w[redacted]d gestation. Patient reports: backache, no bleeding, no contractions, no cramping and no leaking . Fetal movement: normal.  Problem List Items Addressed This Visit    None    Visit Diagnoses    Encounter for supervision of other normal pregnancy in second trimester    -  Primary   Relevant Orders   POCT urinalysis dipstick (Completed)     Patient Active Problem List   Diagnosis Date Noted  . Supervision of normal first pregnancy in first trimester 05/03/2016   Objective:    BP 123/87   Pulse 76   Wt 170 lb (77.1 kg)   LMP 02/17/2016 (Approximate)  FHT: 150 BPM  Uterine Size: size equals dates and at U     Assessment:    Pregnancy @ [redacted]w[redacted]d     Plan:    OBGCT: discussed. Signs and symptoms of preterm labor: discussed.  Labs, problem list reviewed and updated 2 hr GTT planned Follow up in 4 weeks.

## 2016-07-27 ENCOUNTER — Telehealth: Payer: Self-pay | Admitting: Certified Nurse Midwife

## 2016-07-27 NOTE — Telephone Encounter (Signed)
Pt stated she needs a letter stating that she cannot lift a certain amount of pounds. Please advise

## 2016-07-30 ENCOUNTER — Encounter: Payer: Self-pay | Admitting: *Deleted

## 2016-08-03 ENCOUNTER — Ambulatory Visit (INDEPENDENT_AMBULATORY_CARE_PROVIDER_SITE_OTHER): Payer: Medicaid Other | Admitting: Certified Nurse Midwife

## 2016-08-03 ENCOUNTER — Ambulatory Visit (HOSPITAL_COMMUNITY)
Admission: RE | Admit: 2016-08-03 | Discharge: 2016-08-03 | Disposition: A | Discharge status: Home / Self Care | Payer: Medicaid Other | Source: Ambulatory Visit | Attending: Certified Nurse Midwife | Admitting: Certified Nurse Midwife

## 2016-08-03 ENCOUNTER — Encounter: Payer: Self-pay | Admitting: Certified Nurse Midwife

## 2016-08-03 ENCOUNTER — Other Ambulatory Visit (HOSPITAL_COMMUNITY): Payer: Self-pay | Admitting: Obstetrics and Gynecology

## 2016-08-03 ENCOUNTER — Encounter (HOSPITAL_COMMUNITY): Payer: Self-pay

## 2016-08-03 VITALS — BP 121/85 | HR 85 | Temp 97.9°F | Wt 174.7 lb

## 2016-08-03 DIAGNOSIS — O162 Unspecified maternal hypertension, second trimester: Secondary | ICD-10-CM

## 2016-08-03 DIAGNOSIS — Z1389 Encounter for screening for other disorder: Secondary | ICD-10-CM

## 2016-08-03 DIAGNOSIS — Z3A24 24 weeks gestation of pregnancy: Secondary | ICD-10-CM | POA: Insufficient documentation

## 2016-08-03 DIAGNOSIS — Z3492 Encounter for supervision of normal pregnancy, unspecified, second trimester: Secondary | ICD-10-CM | POA: Diagnosis not present

## 2016-08-03 DIAGNOSIS — Z0489 Encounter for examination and observation for other specified reasons: Secondary | ICD-10-CM

## 2016-08-03 DIAGNOSIS — Z36 Encounter for antenatal screening of mother: Secondary | ICD-10-CM | POA: Insufficient documentation

## 2016-08-03 DIAGNOSIS — IMO0002 Reserved for concepts with insufficient information to code with codable children: Secondary | ICD-10-CM

## 2016-08-03 DIAGNOSIS — Z331 Pregnant state, incidental: Secondary | ICD-10-CM

## 2016-08-03 DIAGNOSIS — Z3482 Encounter for supervision of other normal pregnancy, second trimester: Secondary | ICD-10-CM

## 2016-08-03 LAB — POCT URINALYSIS DIPSTICK
Bilirubin, UA: NEGATIVE
Glucose, UA: NEGATIVE
KETONES UA: NEGATIVE
Leukocytes, UA: NEGATIVE
Nitrite, UA: NEGATIVE
PH UA: 7.5
PROTEIN UA: NEGATIVE
RBC UA: NEGATIVE
SPEC GRAV UA: 1.015
UROBILINOGEN UA: 0.2

## 2016-08-03 NOTE — Progress Notes (Signed)
Subjective:    Shelley BasemanHeather M Rojas is a 35 y.o. female being seen today for her obstetrical visit. She is at 6038w0d gestation. Patient reports: no complaints and was worried about breech positioning of fetus, encouragement given . Fetal movement: normal.  Problem List Items Addressed This Visit      Other   Encounter for supervision of other normal pregnancy in second trimester    Other Visit Diagnoses    Prenatal care, second trimester    -  Primary   Relevant Orders   POCT Urinalysis Dipstick (Completed)     Patient Active Problem List   Diagnosis Date Noted  . Encounter for supervision of other normal pregnancy in second trimester 05/03/2016   Objective:    BP 121/85   Pulse 85   Temp 97.9 F (36.6 C)   Wt 174 lb 11.2 oz (79.2 kg)   LMP 02/17/2016 (Approximate)  FHT: 140 BPM  Uterine Size: 24 cm and size equals dates     Assessment:    Pregnancy @ 2138w0d    Breech position of fetus  Planned IOL @38 -39 wks  NSTs @32  weeks for CHTN  Rubella non-immune  GBS+ urine status Plan:    OBGCT: discussed and ordered for next visit. Signs and symptoms of preterm labor: discussed.  Labs, problem list reviewed and updated 2 hr GTT planned Follow up in 3 weeks.

## 2016-08-03 NOTE — Progress Notes (Signed)
Patient has concerns about breach fetus.

## 2016-08-03 NOTE — Patient Instructions (Addendum)
Second Trimester of Pregnancy The second trimester is from week 13 through week 28, months 4 through 6. The second trimester is often a time when you feel your best. Your body has also adjusted to being pregnant, and you begin to feel better physically. Usually, morning sickness has lessened or quit completely, you may have more energy, and you may have an increase in appetite. The second trimester is also a time when the fetus is growing rapidly. At the end of the sixth month, the fetus is about 9 inches long and weighs about 1 pounds. You will likely begin to feel the baby move (quickening) between 18 and 20 weeks of the pregnancy. BODY CHANGES Your body goes through many changes during pregnancy. The changes vary from woman to woman.   Your weight will continue to increase. You will notice your lower abdomen bulging out.  You may begin to get stretch marks on your hips, abdomen, and breasts.  You may develop headaches that can be relieved by medicines approved by your health care provider.  You may urinate more often because the fetus is pressing on your bladder.  You may develop or continue to have heartburn as a result of your pregnancy.  You may develop constipation because certain hormones are causing the muscles that push waste through your intestines to slow down.  You may develop hemorrhoids or swollen, bulging veins (varicose veins).  You may have back pain because of the weight gain and pregnancy hormones relaxing your joints between the bones in your pelvis and as a result of a shift in weight and the muscles that support your balance.  Your breasts will continue to grow and be tender.  Your gums may bleed and may be sensitive to brushing and flossing.  Dark spots or blotches (chloasma, mask of pregnancy) may develop on your face. This will likely fade after the baby is born.  A dark line from your belly button to the pubic area (linea nigra) may appear. This will likely fade  after the baby is born.  You may have changes in your hair. These can include thickening of your hair, rapid growth, and changes in texture. Some women also have hair loss during or after pregnancy, or hair that feels dry or thin. Your hair will most likely return to normal after your baby is born. WHAT TO EXPECT AT YOUR PRENATAL VISITS During a routine prenatal visit:  You will be weighed to make sure you and the fetus are growing normally.  Your blood pressure will be taken.  Your abdomen will be measured to track your baby's growth.  The fetal heartbeat will be listened to.  Any test results from the previous visit will be discussed. Your health care provider may ask you:  How you are feeling.  If you are feeling the baby move.  If you have had any abnormal symptoms, such as leaking fluid, bleeding, severe headaches, or abdominal cramping.  If you are using any tobacco products, including cigarettes, chewing tobacco, and electronic cigarettes.  If you have any questions. Other tests that may be performed during your second trimester include:  Blood tests that check for:  Low iron levels (anemia).  Gestational diabetes (between 24 and 28 weeks).  Rh antibodies.  Urine tests to check for infections, diabetes, or protein in the urine.  An ultrasound to confirm the proper growth and development of the baby.  An amniocentesis to check for possible genetic problems.  Fetal screens for spina bifida   and Down syndrome.  HIV (human immunodeficiency virus) testing. Routine prenatal testing includes screening for HIV, unless you choose not to have this test. HOME CARE INSTRUCTIONS   Avoid all smoking, herbs, alcohol, and unprescribed drugs. These chemicals affect the formation and growth of the baby.  Do not use any tobacco products, including cigarettes, chewing tobacco, and electronic cigarettes. If you need help quitting, ask your health care provider. You may receive  counseling support and other resources to help you quit.  Follow your health care provider's instructions regarding medicine use. There are medicines that are either safe or unsafe to take during pregnancy.  Exercise only as directed by your health care provider. Experiencing uterine cramps is a good sign to stop exercising.  Continue to eat regular, healthy meals.  Wear a good support bra for breast tenderness.  Do not use hot tubs, steam rooms, or saunas.  Wear your seat belt at all times when driving.  Avoid raw meat, uncooked cheese, cat litter boxes, and soil used by cats. These carry germs that can cause birth defects in the baby.  Take your prenatal vitamins.  Take 1500-2000 mg of calcium daily starting at the 20th week of pregnancy until you deliver your baby.  Try taking a stool softener (if your health care provider approves) if you develop constipation. Eat more high-fiber foods, such as fresh vegetables or fruit and whole grains. Drink plenty of fluids to keep your urine clear or pale yellow.  Take warm sitz baths to soothe any pain or discomfort caused by hemorrhoids. Use hemorrhoid cream if your health care provider approves.  If you develop varicose veins, wear support hose. Elevate your feet for 15 minutes, 3-4 times a day. Limit salt in your diet.  Avoid heavy lifting, wear low heel shoes, and practice good posture.  Rest with your legs elevated if you have leg cramps or low back pain.  Visit your dentist if you have not gone yet during your pregnancy. Use a soft toothbrush to brush your teeth and be gentle when you floss.  A sexual relationship may be continued unless your health care provider directs you otherwise.  Continue to go to all your prenatal visits as directed by your health care provider. SEEK MEDICAL CARE IF:   You have dizziness.  You have mild pelvic cramps, pelvic pressure, or nagging pain in the abdominal area.  You have persistent nausea,  vomiting, or diarrhea.  You have a bad smelling vaginal discharge.  You have pain with urination. SEEK IMMEDIATE MEDICAL CARE IF:   You have a fever.  You are leaking fluid from your vagina.  You have spotting or bleeding from your vagina.  You have severe abdominal cramping or pain.  You have rapid weight gain or loss.  You have shortness of breath with chest pain.  You notice sudden or extreme swelling of your face, hands, ankles, feet, or legs.  You have not felt your baby move in over an hour.  You have severe headaches that do not go away with medicine.  You have vision changes.   This information is not intended to replace advice given to you by your health care provider. Make sure you discuss any questions you have with your health care provider.   Document Released: 11/20/2001 Document Revised: 12/17/2014 Document Reviewed: 01/27/2013 Elsevier Interactive Patient Education 2016 [employer] Preterm Labor Information Preterm labor is when labor starts at less than 37 weeks of pregnancy. The normal length of a pregnancy  is 39 to 41 weeks. CAUSES Often, there is no identifiable underlying cause as to why a woman goes into preterm labor. One of the most common known causes of preterm labor is infection. Infections of the uterus, cervix, vagina, amniotic sac, bladder, kidney, or even the lungs (pneumonia) can cause labor to start. Other suspected causes of preterm labor include:   Urogenital infections, such as yeast infections and bacterial vaginosis.   Uterine abnormalities (uterine shape, uterine septum, fibroids, or bleeding from the placenta).   A cervix that has been operated on (it may fail to stay closed).   Malformations in the fetus.   Multiple gestations (twins, triplets, and so on).   Breakage of the amniotic sac.  RISK FACTORS  Having a previous history of preterm labor.   Having premature rupture of membranes (PROM).   Having a placenta  that covers the opening of the cervix (placenta previa).   Having a placenta that separates from the uterus (placental abruption).   Having a cervix that is too weak to hold the fetus in the uterus (incompetent cervix).   Having too much fluid in the amniotic sac (polyhydramnios).   Taking illegal drugs or smoking while pregnant.   Not gaining enough weight while pregnant.   Being younger than 18 and older than 35 years old.   Having a low socioeconomic status.   Being African American. SYMPTOMS Signs and symptoms of preterm labor include:   Menstrual-like cramps, abdominal pain, or back pain.  Uterine contractions that are regular, as frequent as six in an hour, regardless of their intensity (may be mild or painful).  Contractions that start on the top of the uterus and spread down to the lower abdomen and back.   A sense of increased pelvic pressure.   A watery or bloody mucus discharge that comes from the vagina.  TREATMENT Depending on the length of the pregnancy and other circumstances, your health care provider may suggest bed rest. If necessary, there are medicines that can be given to stop contractions and to mature the fetal lungs. If labor happens before 34 weeks of pregnancy, a prolonged hospital stay may be recommended. Treatment depends on the condition of both you and the fetus.  WHAT SHOULD YOU DO IF YOU THINK YOU ARE IN PRETERM LABOR? Call your health care provider right away. You will need to go to the hospital to get checked immediately. HOW CAN YOU PREVENT PRETERM LABOR IN FUTURE PREGNANCIES? You should:   Stop smoking if you smoke.  Maintain healthy weight gain and avoid chemicals and drugs that are not necessary.  Be watchful for any type of infection.  Inform your health care provider if you have a known history of preterm labor.   This information is not intended to replace advice given to you by your health care provider. Make sure you  discuss any questions you have with your health care provider.   Document Released: 02/16/2004 Document Revised: 07/29/2013 Document Reviewed: 12/29/2012 Elsevier Interactive Patient Education 2016 [employer]vier Inc.  

## 2016-08-22 ENCOUNTER — Telehealth: Payer: Self-pay | Admitting: *Deleted

## 2016-08-22 NOTE — Telephone Encounter (Signed)
Patient called c/o sore throat,runny nose,headache,and vomiting.Denies fever.Explained to patient she may have plain Tylenol for headache,plain Robitussin for cough and drink warm fluids.Claritan 1 tablet daily,and Promethazine 25 mg every 6 hour prn called to [facility] [address] [phone].

## 2016-08-31 ENCOUNTER — Ambulatory Visit (HOSPITAL_COMMUNITY)
Admission: RE | Admit: 2016-08-31 | Discharge: 2016-08-31 | Disposition: A | Discharge status: Home / Self Care | Payer: Medicaid Other | Source: Ambulatory Visit | Attending: Certified Nurse Midwife | Admitting: Certified Nurse Midwife

## 2016-08-31 ENCOUNTER — Encounter (HOSPITAL_COMMUNITY): Payer: Self-pay

## 2016-08-31 ENCOUNTER — Other Ambulatory Visit: Payer: Medicaid Other

## 2016-08-31 ENCOUNTER — Other Ambulatory Visit (HOSPITAL_COMMUNITY): Payer: Self-pay | Admitting: Obstetrics and Gynecology

## 2016-08-31 DIAGNOSIS — O10019 Pre-existing essential hypertension complicating pregnancy, unspecified trimester: Secondary | ICD-10-CM | POA: Diagnosis not present

## 2016-08-31 DIAGNOSIS — Z3A28 28 weeks gestation of pregnancy: Secondary | ICD-10-CM | POA: Insufficient documentation

## 2016-08-31 DIAGNOSIS — O163 Unspecified maternal hypertension, third trimester: Secondary | ICD-10-CM

## 2016-08-31 DIAGNOSIS — O162 Unspecified maternal hypertension, second trimester: Secondary | ICD-10-CM

## 2016-08-31 DIAGNOSIS — O132 Gestational [pregnancy-induced] hypertension without significant proteinuria, second trimester: Secondary | ICD-10-CM | POA: Diagnosis not present

## 2016-09-07 ENCOUNTER — Ambulatory Visit (INDEPENDENT_AMBULATORY_CARE_PROVIDER_SITE_OTHER): Payer: Medicaid Other | Admitting: Certified Nurse Midwife

## 2016-09-07 ENCOUNTER — Other Ambulatory Visit: Payer: Medicaid Other

## 2016-09-07 VITALS — BP 116/86 | HR 76

## 2016-09-07 DIAGNOSIS — O0993 Supervision of high risk pregnancy, unspecified, third trimester: Secondary | ICD-10-CM

## 2016-09-07 DIAGNOSIS — Z3483 Encounter for supervision of other normal pregnancy, third trimester: Secondary | ICD-10-CM | POA: Diagnosis not present

## 2016-09-07 NOTE — Progress Notes (Signed)
Subjective:    Shelley BasemanHeather M Rojas is a 35 y.o. female being seen today for her obstetrical visit. She is at 5374w0d gestation. Patient reports no complaints. Fetal movement: normal.  Is taking the labetalol.    Problem List Items Addressed This Visit      Other   Supervision of high-risk pregnancy    Other Visit Diagnoses    Encounter for supervision of other normal pregnancy in third trimester    -  Primary   Relevant Orders   Glucose Tolerance, 2 Hours w/1 Hour   CBC   HIV antibody   RPR     Patient Active Problem List   Diagnosis Date Noted  . Supervision of high-risk pregnancy 05/03/2016   Objective:    BP 116/86   Pulse 76   LMP 02/17/2016 (Approximate)  FHT:  150 BPM  Uterine Size: 30 cm and size equals dates  Presentation: cephalic     Assessment:    Pregnancy @ 1574w0d weeks   CHTN Plan:    2 hour OGTT today   labs reviewed, problem list updated Consent signed. GBS planning TDAP offered  Rhogam given for RH negative Pediatrician: discussed. Infant feeding: plans to bottle feed. Maternity leave: discussed. Cigarette smoking: never smoked. Orders Placed This Encounter  Procedures  . Glucose Tolerance, 2 Hours w/1 Hour  . CBC  . HIV antibody  . RPR   No orders of the defined types were placed in this encounter.  Follow up in 2 Weeks.

## 2016-09-08 LAB — CBC
HEMATOCRIT: 35.6 % (ref 34.0–46.6)
HEMOGLOBIN: 11.8 g/dL (ref 11.1–15.9)
MCH: 30.7 pg (ref 26.6–33.0)
MCHC: 33.1 g/dL (ref 31.5–35.7)
MCV: 93 fL (ref 79–97)
Platelets: 205 10*3/uL (ref 150–379)
RBC: 3.84 x10E6/uL (ref 3.77–5.28)
RDW: 13.5 % (ref 12.3–15.4)
WBC: 7.7 10*3/uL (ref 3.4–10.8)

## 2016-09-08 LAB — GLUCOSE TOLERANCE, 2 HOURS W/ 1HR
Glucose, 1 hour: 164 mg/dL (ref 65–179)
Glucose, 2 hour: 123 mg/dL (ref 65–152)
Glucose, Fasting: 79 mg/dL (ref 65–91)

## 2016-09-08 LAB — RPR: RPR Ser Ql: NONREACTIVE

## 2016-09-08 LAB — HIV ANTIBODY (ROUTINE TESTING W REFLEX): HIV Screen 4th Generation wRfx: NONREACTIVE

## 2016-09-11 ENCOUNTER — Other Ambulatory Visit: Payer: Self-pay | Admitting: Certified Nurse Midwife

## 2016-09-11 DIAGNOSIS — O0993 Supervision of high risk pregnancy, unspecified, third trimester: Secondary | ICD-10-CM

## 2016-09-28 ENCOUNTER — Ambulatory Visit (HOSPITAL_COMMUNITY)
Admission: RE | Admit: 2016-09-28 | Discharge: 2016-09-28 | Disposition: A | Discharge status: Home / Self Care | Payer: Medicaid Other | Source: Ambulatory Visit | Attending: Certified Nurse Midwife | Admitting: Certified Nurse Midwife

## 2016-09-28 ENCOUNTER — Other Ambulatory Visit (HOSPITAL_COMMUNITY): Payer: Self-pay | Admitting: Obstetrics and Gynecology

## 2016-09-28 ENCOUNTER — Encounter (HOSPITAL_COMMUNITY): Payer: Self-pay

## 2016-09-28 DIAGNOSIS — Z3A32 32 weeks gestation of pregnancy: Secondary | ICD-10-CM | POA: Diagnosis not present

## 2016-09-28 DIAGNOSIS — O10919 Unspecified pre-existing hypertension complicating pregnancy, unspecified trimester: Secondary | ICD-10-CM

## 2016-09-28 DIAGNOSIS — O162 Unspecified maternal hypertension, second trimester: Secondary | ICD-10-CM

## 2016-09-28 DIAGNOSIS — O10013 Pre-existing essential hypertension complicating pregnancy, third trimester: Secondary | ICD-10-CM | POA: Diagnosis present

## 2016-10-01 ENCOUNTER — Ambulatory Visit (INDEPENDENT_AMBULATORY_CARE_PROVIDER_SITE_OTHER): Payer: Medicaid Other | Admitting: Obstetrics

## 2016-10-01 ENCOUNTER — Other Ambulatory Visit (HOSPITAL_COMMUNITY): Payer: Self-pay | Admitting: *Deleted

## 2016-10-01 ENCOUNTER — Encounter: Payer: Self-pay | Admitting: Obstetrics

## 2016-10-01 VITALS — BP 125/88 | HR 80 | Temp 98.3°F | Wt 181.7 lb

## 2016-10-01 DIAGNOSIS — K219 Gastro-esophageal reflux disease without esophagitis: Secondary | ICD-10-CM

## 2016-10-01 DIAGNOSIS — O0993 Supervision of high risk pregnancy, unspecified, third trimester: Secondary | ICD-10-CM

## 2016-10-01 DIAGNOSIS — O10913 Unspecified pre-existing hypertension complicating pregnancy, third trimester: Secondary | ICD-10-CM

## 2016-10-01 DIAGNOSIS — R8271 Bacteriuria: Secondary | ICD-10-CM

## 2016-10-01 DIAGNOSIS — O10919 Unspecified pre-existing hypertension complicating pregnancy, unspecified trimester: Secondary | ICD-10-CM | POA: Insufficient documentation

## 2016-10-01 DIAGNOSIS — Z23 Encounter for immunization: Secondary | ICD-10-CM

## 2016-10-01 MED ORDER — OMEPRAZOLE 20 MG PO CPDR: 20.0000 mg | DELAYED_RELEASE_CAPSULE | Freq: Two times a day (BID) | ORAL | 5 refills | Status: DC

## 2016-10-01 NOTE — Progress Notes (Signed)
Patient reports she has an increase in heart burn. She also has bilateral leg pain- more R leg.Cramping-all down the leg- sometimes lasting in the thigh.

## 2016-10-01 NOTE — Addendum Note (Signed)
Addended by: Elby BeckPAUL, Gaines Cartmell F on: 10/01/2016 04:44 PM   Modules accepted: Orders

## 2016-10-05 ENCOUNTER — Ambulatory Visit (HOSPITAL_COMMUNITY)
Admission: RE | Admit: 2016-10-05 | Discharge: 2016-10-05 | Disposition: A | Discharge status: Home / Self Care | Payer: Medicaid Other | Source: Ambulatory Visit | Attending: Certified Nurse Midwife | Admitting: Certified Nurse Midwife

## 2016-10-05 ENCOUNTER — Encounter (HOSPITAL_COMMUNITY): Payer: Self-pay

## 2016-10-05 DIAGNOSIS — R8271 Bacteriuria: Secondary | ICD-10-CM

## 2016-10-05 DIAGNOSIS — Z3A33 33 weeks gestation of pregnancy: Secondary | ICD-10-CM | POA: Insufficient documentation

## 2016-10-05 DIAGNOSIS — Z79899 Other long term (current) drug therapy: Secondary | ICD-10-CM | POA: Insufficient documentation

## 2016-10-05 DIAGNOSIS — O10919 Unspecified pre-existing hypertension complicating pregnancy, unspecified trimester: Secondary | ICD-10-CM

## 2016-10-05 DIAGNOSIS — O10013 Pre-existing essential hypertension complicating pregnancy, third trimester: Secondary | ICD-10-CM | POA: Diagnosis not present

## 2016-10-05 DIAGNOSIS — O10913 Unspecified pre-existing hypertension complicating pregnancy, third trimester: Secondary | ICD-10-CM

## 2016-10-05 HISTORY — DX: Essential (primary) hypertension: I10

## 2016-10-12 ENCOUNTER — Ambulatory Visit (HOSPITAL_COMMUNITY)
Admission: RE | Admit: 2016-10-12 | Discharge: 2016-10-12 | Disposition: A | Discharge status: Home / Self Care | Payer: Medicaid Other | Source: Ambulatory Visit | Attending: Certified Nurse Midwife | Admitting: Certified Nurse Midwife

## 2016-10-12 ENCOUNTER — Other Ambulatory Visit (HOSPITAL_COMMUNITY): Payer: Self-pay | Admitting: Maternal and Fetal Medicine

## 2016-10-12 ENCOUNTER — Encounter (HOSPITAL_COMMUNITY): Payer: Self-pay

## 2016-10-12 DIAGNOSIS — O10013 Pre-existing essential hypertension complicating pregnancy, third trimester: Secondary | ICD-10-CM | POA: Insufficient documentation

## 2016-10-12 DIAGNOSIS — Z3A34 34 weeks gestation of pregnancy: Secondary | ICD-10-CM | POA: Diagnosis not present

## 2016-10-12 DIAGNOSIS — R8271 Bacteriuria: Secondary | ICD-10-CM

## 2016-10-12 DIAGNOSIS — O10913 Unspecified pre-existing hypertension complicating pregnancy, third trimester: Secondary | ICD-10-CM

## 2016-10-12 DIAGNOSIS — O10919 Unspecified pre-existing hypertension complicating pregnancy, unspecified trimester: Secondary | ICD-10-CM

## 2016-10-15 ENCOUNTER — Ambulatory Visit (INDEPENDENT_AMBULATORY_CARE_PROVIDER_SITE_OTHER): Payer: Medicaid Other | Admitting: Certified Nurse Midwife

## 2016-10-15 VITALS — BP 122/84 | HR 97 | Wt 181.0 lb

## 2016-10-15 DIAGNOSIS — O0993 Supervision of high risk pregnancy, unspecified, third trimester: Secondary | ICD-10-CM

## 2016-10-15 NOTE — Progress Notes (Signed)
Pt would like to discuss going out of work for maternity leave effective this Friday.

## 2016-10-15 NOTE — Progress Notes (Signed)
Subjective:    Shelley Rojas is a 35 y.o. female being seen today for her obstetrical visit. She is at 9071w3d gestation. Patient reports headache, no contractions, no cramping, no leaking and relieved with OTC tylenol, has infected hair follicle on abdomen: OTC antibiotic ointment, hot compresses. Fetal movement: normal.  Problem List Items Addressed This Visit      Other   Supervision of high-risk pregnancy - Primary     Patient Active Problem List   Diagnosis Date Noted  . GBS bacteriuria 10/01/2016  . Chronic hypertension affecting pregnancy 10/01/2016  . Supervision of high-risk pregnancy 05/03/2016   Objective:    BP 122/84   Pulse 97   Wt 181 lb (82.1 kg)   LMP 02/17/2016 (Approximate)  FHT:  136 BPM  Uterine Size: 34 cm and size equals dates  Presentation: cephalic    BPP: 16/12/909/3/17: 8/8 Assessment:    Pregnancy @ 2971w3d weeks   abdominal folliculitis  Plan:     labs reviewed, problem list updated Consent signed. GBS planning TDAP offered  Rhogam given for RH negative Pediatrician: discussed. Infant feeding: plans to breastfeed. Maternity leave: discussed, forms done. Cigarette smoking: quit at start of pregnancy. No orders of the defined types were placed in this encounter.  No orders of the defined types were placed in this encounter.  Follow up in 1 Week.

## 2016-10-18 ENCOUNTER — Telehealth: Payer: Self-pay | Admitting: *Deleted

## 2016-10-18 NOTE — Telephone Encounter (Signed)
Pt made aware paperwork for work is complete. Pt would like faxed to employer.  Pt made aware that forms will be faxed and scanned to her chart.

## 2016-10-19 ENCOUNTER — Ambulatory Visit (HOSPITAL_COMMUNITY)
Admission: RE | Admit: 2016-10-19 | Discharge: 2016-10-19 | Disposition: A | Discharge status: Home / Self Care | Payer: Medicaid Other | Source: Ambulatory Visit | Attending: Certified Nurse Midwife | Admitting: Certified Nurse Midwife

## 2016-10-19 ENCOUNTER — Encounter (HOSPITAL_COMMUNITY): Payer: Self-pay

## 2016-10-19 DIAGNOSIS — O10013 Pre-existing essential hypertension complicating pregnancy, third trimester: Secondary | ICD-10-CM | POA: Insufficient documentation

## 2016-10-19 DIAGNOSIS — O10913 Unspecified pre-existing hypertension complicating pregnancy, third trimester: Secondary | ICD-10-CM

## 2016-10-19 DIAGNOSIS — Z3A35 35 weeks gestation of pregnancy: Secondary | ICD-10-CM | POA: Insufficient documentation

## 2016-10-22 ENCOUNTER — Ambulatory Visit (INDEPENDENT_AMBULATORY_CARE_PROVIDER_SITE_OTHER): Payer: Medicaid Other | Admitting: Certified Nurse Midwife

## 2016-10-22 VITALS — BP 116/79 | HR 85 | Wt 180.0 lb

## 2016-10-22 DIAGNOSIS — O0993 Supervision of high risk pregnancy, unspecified, third trimester: Secondary | ICD-10-CM | POA: Diagnosis not present

## 2016-10-22 NOTE — Progress Notes (Signed)
Subjective:    Shelley BasemanHeather M Rojas is a 35 y.o. female being seen today for her obstetrical visit. She is at 5317w3d gestation. Patient reports no complaints. Fetal movement: normal.  Problem List Items Addressed This Visit      Other   Supervision of high-risk pregnancy - Primary   Relevant Orders   NuSwab Vaginitis Plus (VG+)   Culture, beta strep (group b only)     Patient Active Problem List   Diagnosis Date Noted  . GBS bacteriuria 10/01/2016  . Chronic hypertension affecting pregnancy 10/01/2016  . Supervision of high-risk pregnancy 05/03/2016   Objective:    BP 116/79   Pulse 85   Wt 180 lb (81.6 kg)   LMP 02/17/2016 (Approximate)  FHT:  145 BPM  Uterine Size: 35 cm and size equals dates  Presentation: cephalic    NST: + accels, no decels, moderate variability, Cat. 1 tracing. No contractions on toco.    Cervix:    Assessment:    Pregnancy @ 2617w3d weeks    Reactive NST  Plan:     labs reviewed, problem list updated Consent signed. GBS sent TDAP offered  Rhogam given for RH negative Pediatrician: discussed. Infant feeding: plans to breastfeed. Maternity leave: discussed. Cigarette smoking: never smoked. Orders Placed This Encounter  Procedures  . Culture, beta strep (group b only)  . NuSwab Vaginitis Plus (VG+)   No orders of the defined types were placed in this encounter.  Follow up in 1 Week with NST.

## 2016-10-25 ENCOUNTER — Other Ambulatory Visit: Payer: Self-pay | Admitting: Certified Nurse Midwife

## 2016-10-25 LAB — NUSWAB VAGINITIS PLUS (VG+)
CANDIDA ALBICANS, NAA: NEGATIVE
Candida glabrata, NAA: NEGATIVE
Chlamydia trachomatis, NAA: NEGATIVE
Megasphaera 1: HIGH Score — AB
NEISSERIA GONORRHOEAE, NAA: NEGATIVE
Trich vag by NAA: NEGATIVE

## 2016-10-25 LAB — CULTURE, BETA STREP (GROUP B ONLY): Strep Gp B Culture: POSITIVE — AB

## 2016-10-25 LAB — OB RESULTS CONSOLE GBS: GBS: POSITIVE

## 2016-10-26 ENCOUNTER — Encounter (HOSPITAL_COMMUNITY): Payer: Self-pay

## 2016-10-26 ENCOUNTER — Ambulatory Visit (HOSPITAL_COMMUNITY)
Admission: RE | Admit: 2016-10-26 | Discharge: 2016-10-26 | Disposition: A | Discharge status: Home / Self Care | Payer: Medicaid Other | Source: Ambulatory Visit | Attending: Certified Nurse Midwife | Admitting: Certified Nurse Midwife

## 2016-10-26 DIAGNOSIS — Z79899 Other long term (current) drug therapy: Secondary | ICD-10-CM | POA: Insufficient documentation

## 2016-10-26 DIAGNOSIS — O10913 Unspecified pre-existing hypertension complicating pregnancy, third trimester: Secondary | ICD-10-CM | POA: Diagnosis present

## 2016-10-26 DIAGNOSIS — R8271 Bacteriuria: Secondary | ICD-10-CM

## 2016-10-26 DIAGNOSIS — Z3A36 36 weeks gestation of pregnancy: Secondary | ICD-10-CM | POA: Insufficient documentation

## 2016-10-26 DIAGNOSIS — O10919 Unspecified pre-existing hypertension complicating pregnancy, unspecified trimester: Secondary | ICD-10-CM

## 2016-10-29 ENCOUNTER — Ambulatory Visit (INDEPENDENT_AMBULATORY_CARE_PROVIDER_SITE_OTHER): Payer: Medicaid Other | Admitting: Certified Nurse Midwife

## 2016-10-29 VITALS — BP 132/89 | HR 81 | Wt 82.0 lb

## 2016-10-29 DIAGNOSIS — O10913 Unspecified pre-existing hypertension complicating pregnancy, third trimester: Secondary | ICD-10-CM | POA: Diagnosis not present

## 2016-10-29 DIAGNOSIS — O0993 Supervision of high risk pregnancy, unspecified, third trimester: Secondary | ICD-10-CM

## 2016-10-29 DIAGNOSIS — O10919 Unspecified pre-existing hypertension complicating pregnancy, unspecified trimester: Secondary | ICD-10-CM

## 2016-10-29 DIAGNOSIS — R8271 Bacteriuria: Secondary | ICD-10-CM

## 2016-10-29 NOTE — Progress Notes (Signed)
Subjective:    Shelley BasemanHeather M Rojas is a 35 y.o. female being seen today for her obstetrical visit. She is at 3673w3d gestation. Patient reports backache, no bleeding, no cramping, no leaking and occasional contractions. Fetal movement: normal.  Problem List Items Addressed This Visit      Cardiovascular and Mediastinum   Chronic hypertension affecting pregnancy     Other   Supervision of high-risk pregnancy - Primary   GBS bacteriuria     Patient Active Problem List   Diagnosis Date Noted  . GBS bacteriuria 10/01/2016  . Chronic hypertension affecting pregnancy 10/01/2016  . Supervision of high-risk pregnancy 05/03/2016   Objective:    BP 132/89   Pulse 81   Wt 82 lb (37.2 kg)   LMP 02/17/2016 (Approximate)  FHT:  145 BPM  Uterine Size: 37 cm and size equals dates  Presentation: cephalic    NST: + accels, no decels, moderate variability, Cat. 1 tracing. No contractions on toco.    Assessment:    Pregnancy @ 6973w3d weeks   Reactive NST  GBS+   CHTN: on labetalol  Plan:    IOL scheduled per MFM recommendation on 11/12/16@0730 .     labs reviewed, problem list updated Consent signed. GBS + TDAP offered  Rhogam given for RH negative Pediatrician: discussed. Infant feeding: plans to breastfeed. Maternity leave: discussed. Cigarette smoking: quit at start of pregnancy. No orders of the defined types were placed in this encounter.  No orders of the defined types were placed in this encounter.  Follow up in 1 Week with NST.

## 2016-11-02 ENCOUNTER — Ambulatory Visit (HOSPITAL_COMMUNITY)
Admission: RE | Admit: 2016-11-02 | Discharge: 2016-11-02 | Disposition: A | Discharge status: Home / Self Care | Payer: Medicaid Other | Source: Ambulatory Visit | Attending: Certified Nurse Midwife | Admitting: Certified Nurse Midwife

## 2016-11-02 ENCOUNTER — Encounter (HOSPITAL_COMMUNITY): Payer: Self-pay

## 2016-11-02 DIAGNOSIS — O10913 Unspecified pre-existing hypertension complicating pregnancy, third trimester: Secondary | ICD-10-CM | POA: Insufficient documentation

## 2016-11-02 DIAGNOSIS — R8271 Bacteriuria: Secondary | ICD-10-CM

## 2016-11-02 DIAGNOSIS — Z3A37 37 weeks gestation of pregnancy: Secondary | ICD-10-CM | POA: Insufficient documentation

## 2016-11-02 DIAGNOSIS — Z79899 Other long term (current) drug therapy: Secondary | ICD-10-CM | POA: Insufficient documentation

## 2016-11-02 DIAGNOSIS — O10919 Unspecified pre-existing hypertension complicating pregnancy, unspecified trimester: Secondary | ICD-10-CM

## 2016-11-05 ENCOUNTER — Ambulatory Visit (INDEPENDENT_AMBULATORY_CARE_PROVIDER_SITE_OTHER): Payer: Medicaid Other | Admitting: Obstetrics

## 2016-11-05 DIAGNOSIS — O10919 Unspecified pre-existing hypertension complicating pregnancy, unspecified trimester: Secondary | ICD-10-CM

## 2016-11-05 DIAGNOSIS — O0993 Supervision of high risk pregnancy, unspecified, third trimester: Secondary | ICD-10-CM

## 2016-11-05 DIAGNOSIS — O10913 Unspecified pre-existing hypertension complicating pregnancy, third trimester: Secondary | ICD-10-CM

## 2016-11-06 ENCOUNTER — Encounter: Payer: Self-pay | Admitting: Obstetrics

## 2016-11-06 ENCOUNTER — Encounter (HOSPITAL_COMMUNITY): Payer: Self-pay | Admitting: *Deleted

## 2016-11-06 ENCOUNTER — Telehealth (HOSPITAL_COMMUNITY): Payer: Self-pay | Admitting: *Deleted

## 2016-11-06 NOTE — Progress Notes (Signed)
Subjective:    Geroge BasemanHeather M Propst is a 35 y.o. female being seen today for her obstetrical visit. She is at 4266w4d gestation. Patient reports no complaints. Fetal movement: normal.  Problem List Items Addressed This Visit    Chronic hypertension affecting pregnancy   Supervision of high-risk pregnancy     Patient Active Problem List   Diagnosis Date Noted  . GBS bacteriuria 10/01/2016  . Chronic hypertension affecting pregnancy 10/01/2016  . Supervision of high-risk pregnancy 05/03/2016    Objective:    BP 120/82   Pulse 88   Wt 184 lb 12.8 oz (83.8 kg)   LMP 02/17/2016 (Approximate)  FHT: 150 BPM  Uterine Size: size equals dates  Presentations: unsure   Assessment:    Pregnancy @ 4966w4d weeks   Plan:   Plans for delivery: Vaginal anticipated; labs reviewed; problem list updated Counseling: Consent signed. Infant feeding: plans to breastfeed. Cigarette smoking: smoking L&D discussion: symptoms of labor, discussed when to call, discussed what number to call, anesthetic/analgesic options reviewed and delivering clinician:  plans no preference. Postpartum supports and preparation: circumcision discussed and contraception plans discussed.  IOL scheduled.Patient ID: Aliene BeamsHeather M Propst, female   DOB: [birth year], 35 y.o.   MRN: [id number]

## 2016-11-06 NOTE — Telephone Encounter (Signed)
Preadmission screen  

## 2016-11-09 ENCOUNTER — Encounter (HOSPITAL_COMMUNITY): Payer: Self-pay

## 2016-11-09 ENCOUNTER — Other Ambulatory Visit (HOSPITAL_COMMUNITY): Payer: Self-pay | Admitting: Maternal and Fetal Medicine

## 2016-11-09 ENCOUNTER — Ambulatory Visit (HOSPITAL_COMMUNITY)
Admission: RE | Admit: 2016-11-09 | Discharge: 2016-11-09 | Disposition: A | Discharge status: Home / Self Care | Payer: Medicaid Other | Source: Ambulatory Visit | Attending: Certified Nurse Midwife | Admitting: Certified Nurse Midwife

## 2016-11-09 DIAGNOSIS — Z3A38 38 weeks gestation of pregnancy: Secondary | ICD-10-CM

## 2016-11-09 DIAGNOSIS — O10913 Unspecified pre-existing hypertension complicating pregnancy, third trimester: Secondary | ICD-10-CM | POA: Insufficient documentation

## 2016-11-12 ENCOUNTER — Inpatient Hospital Stay (HOSPITAL_COMMUNITY): Payer: Medicaid Other | Admitting: Anesthesiology

## 2016-11-12 ENCOUNTER — Inpatient Hospital Stay (HOSPITAL_COMMUNITY)
Admission: RE | Admit: 2016-11-12 | Discharge: 2016-11-14 | DRG: 774 | Disposition: A | Discharge status: Home / Self Care | Payer: Medicaid Other | Source: Ambulatory Visit | Attending: Family Medicine | Admitting: Family Medicine

## 2016-11-12 ENCOUNTER — Encounter (HOSPITAL_COMMUNITY): Payer: Self-pay

## 2016-11-12 VITALS — BP 116/73 | HR 58 | Temp 98.1°F | Resp 18 | Ht 68.0 in | Wt 185.0 lb

## 2016-11-12 DIAGNOSIS — R8271 Bacteriuria: Secondary | ICD-10-CM

## 2016-11-12 DIAGNOSIS — Z8249 Family history of ischemic heart disease and other diseases of the circulatory system: Secondary | ICD-10-CM | POA: Diagnosis not present

## 2016-11-12 DIAGNOSIS — Z3A38 38 weeks gestation of pregnancy: Secondary | ICD-10-CM

## 2016-11-12 DIAGNOSIS — O1002 Pre-existing essential hypertension complicating childbirth: Principal | ICD-10-CM | POA: Diagnosis present

## 2016-11-12 DIAGNOSIS — O169 Unspecified maternal hypertension, unspecified trimester: Secondary | ICD-10-CM | POA: Diagnosis present

## 2016-11-12 DIAGNOSIS — O163 Unspecified maternal hypertension, third trimester: Secondary | ICD-10-CM

## 2016-11-12 DIAGNOSIS — O10919 Unspecified pre-existing hypertension complicating pregnancy, unspecified trimester: Secondary | ICD-10-CM

## 2016-11-12 DIAGNOSIS — O99824 Streptococcus B carrier state complicating childbirth: Secondary | ICD-10-CM

## 2016-11-12 DIAGNOSIS — O99334 Smoking (tobacco) complicating childbirth: Secondary | ICD-10-CM | POA: Diagnosis present

## 2016-11-12 LAB — CBC
HEMATOCRIT: 32.6 % — AB (ref 36.0–46.0)
HEMOGLOBIN: 10.9 g/dL — AB (ref 12.0–15.0)
MCH: 29.3 pg (ref 26.0–34.0)
MCHC: 33.4 g/dL (ref 30.0–36.0)
MCV: 87.6 fL (ref 78.0–100.0)
Platelets: 256 10*3/uL (ref 150–400)
RBC: 3.72 MIL/uL — ABNORMAL LOW (ref 3.87–5.11)
RDW: 13.7 % (ref 11.5–15.5)
WBC: 10.2 10*3/uL (ref 4.0–10.5)

## 2016-11-12 LAB — TYPE AND SCREEN
ABO/RH(D): A POS
ANTIBODY SCREEN: NEGATIVE

## 2016-11-12 LAB — ABO/RH: ABO/RH(D): A POS

## 2016-11-12 MED ORDER — BENZOCAINE-MENTHOL 20-0.5 % EX AERO: 1.0000 "application " | INHALATION_SPRAY | CUTANEOUS | Status: DC | PRN

## 2016-11-12 MED ORDER — DIPHENHYDRAMINE HCL 50 MG/ML IJ SOLN: 12.5000 mg | INTRAMUSCULAR | Status: DC | PRN

## 2016-11-12 MED ORDER — LIDOCAINE HCL (PF) 1 % IJ SOLN
30.0000 mL | INTRAMUSCULAR | Status: DC | PRN
  Filled 2016-11-12: qty 30

## 2016-11-12 MED ORDER — DIBUCAINE 1 % RE OINT: 1.0000 "application " | TOPICAL_OINTMENT | RECTAL | Status: DC | PRN

## 2016-11-12 MED ORDER — EPHEDRINE 5 MG/ML INJ: 10.0000 mg | INTRAVENOUS | Status: DC | PRN

## 2016-11-12 MED ORDER — SENNOSIDES-DOCUSATE SODIUM 8.6-50 MG PO TABS
2.0000 | ORAL_TABLET | ORAL | Status: DC
  Administered 2016-11-13: 2 via ORAL
  Filled 2016-11-12: qty 2

## 2016-11-12 MED ORDER — SIMETHICONE 80 MG PO CHEW: 80.0000 mg | CHEWABLE_TABLET | ORAL | Status: DC | PRN

## 2016-11-12 MED ORDER — ONDANSETRON HCL 4 MG/2ML IJ SOLN: 4.0000 mg | Freq: Four times a day (QID) | INTRAMUSCULAR | Status: DC | PRN

## 2016-11-12 MED ORDER — ZOLPIDEM TARTRATE 5 MG PO TABS: 5.0000 mg | ORAL_TABLET | Freq: Every evening | ORAL | Status: DC | PRN

## 2016-11-12 MED ORDER — OXYCODONE-ACETAMINOPHEN 5-325 MG PO TABS
2.0000 | ORAL_TABLET | ORAL | Status: DC | PRN
Start: 2016-11-12 — End: 2016-11-12

## 2016-11-12 MED ORDER — LACTATED RINGERS IV SOLN
INTRAVENOUS | Status: DC
  Administered 2016-11-12 (×2): via INTRAVENOUS

## 2016-11-12 MED ORDER — DIPHENHYDRAMINE HCL 25 MG PO CAPS: 25.0000 mg | ORAL_CAPSULE | Freq: Four times a day (QID) | ORAL | Status: DC | PRN

## 2016-11-12 MED ORDER — OXYTOCIN 40 UNITS IN LACTATED RINGERS INFUSION - SIMPLE MED
1.0000 m[IU]/min | INTRAVENOUS | Status: DC
  Administered 2016-11-12: 2 m[IU]/min via INTRAVENOUS
  Filled 2016-11-12: qty 1000

## 2016-11-12 MED ORDER — TETANUS-DIPHTH-ACELL PERTUSSIS 5-2.5-18.5 LF-MCG/0.5 IM SUSP: 0.5000 mL | Freq: Once | INTRAMUSCULAR | Status: DC

## 2016-11-12 MED ORDER — PRENATAL MULTIVITAMIN CH
1.0000 | ORAL_TABLET | Freq: Every day | ORAL | Status: DC
  Administered 2016-11-13: 1 via ORAL
  Filled 2016-11-12: qty 1

## 2016-11-12 MED ORDER — ZOLPIDEM TARTRATE 5 MG PO TABS
5.0000 mg | ORAL_TABLET | Freq: Every evening | ORAL | Status: DC | PRN
Start: 2016-11-12 — End: 2016-11-14

## 2016-11-12 MED ORDER — IBUPROFEN 600 MG PO TABS: 600.0000 mg | ORAL_TABLET | Freq: Four times a day (QID) | ORAL | Status: DC

## 2016-11-12 MED ORDER — WITCH HAZEL-GLYCERIN EX PADS: 1.0000 "application " | MEDICATED_PAD | CUTANEOUS | Status: DC | PRN

## 2016-11-12 MED ORDER — ACETAMINOPHEN 325 MG PO TABS: 650.0000 mg | ORAL_TABLET | ORAL | Status: DC | PRN

## 2016-11-12 MED ORDER — ONDANSETRON HCL 4 MG PO TABS: 4.0000 mg | ORAL_TABLET | ORAL | Status: DC | PRN

## 2016-11-12 MED ORDER — SIMETHICONE 80 MG PO CHEW
80.0000 mg | CHEWABLE_TABLET | ORAL | Status: DC | PRN
Start: 2016-11-12 — End: 2016-11-14

## 2016-11-12 MED ORDER — COCONUT OIL OIL: 1.0000 "application " | TOPICAL_OIL | Status: DC | PRN

## 2016-11-12 MED ORDER — ONDANSETRON HCL 4 MG PO TABS
4.0000 mg | ORAL_TABLET | ORAL | Status: DC | PRN
  Administered 2016-11-12: 4 mg via ORAL
  Filled 2016-11-12: qty 1

## 2016-11-12 MED ORDER — PRENATAL MULTIVITAMIN CH: 1.0000 | ORAL_TABLET | Freq: Every day | ORAL | Status: DC

## 2016-11-12 MED ORDER — MISOPROSTOL 25 MCG QUARTER TABLET
25.0000 ug | ORAL_TABLET | ORAL | Status: DC | PRN
  Administered 2016-11-12: 25 ug via VAGINAL
  Filled 2016-11-12: qty 0.25

## 2016-11-12 MED ORDER — TERBUTALINE SULFATE 1 MG/ML IJ SOLN: 0.2500 mg | Freq: Once | INTRAMUSCULAR | Status: DC | PRN

## 2016-11-12 MED ORDER — LABETALOL HCL 200 MG PO TABS
200.0000 mg | ORAL_TABLET | Freq: Two times a day (BID) | ORAL | Status: DC
  Administered 2016-11-12: 200 mg via ORAL
  Filled 2016-11-12 (×3): qty 1

## 2016-11-12 MED ORDER — LIDOCAINE HCL (PF) 1 % IJ SOLN
INTRAMUSCULAR | Status: DC | PRN
  Administered 2016-11-12 (×2): 7 mL via EPIDURAL

## 2016-11-12 MED ORDER — SENNOSIDES-DOCUSATE SODIUM 8.6-50 MG PO TABS: 2.0000 | ORAL_TABLET | ORAL | Status: DC

## 2016-11-12 MED ORDER — ACETAMINOPHEN 325 MG PO TABS
650.0000 mg | ORAL_TABLET | ORAL | Status: DC | PRN
  Administered 2016-11-13: 650 mg via ORAL
  Filled 2016-11-12: qty 2

## 2016-11-12 MED ORDER — PHENYLEPHRINE 40 MCG/ML (10ML) SYRINGE FOR IV PUSH (FOR BLOOD PRESSURE SUPPORT)
80.0000 ug | PREFILLED_SYRINGE | INTRAVENOUS | Status: DC | PRN
  Filled 2016-11-12: qty 10

## 2016-11-12 MED ORDER — ONDANSETRON HCL 4 MG/2ML IJ SOLN: 4.0000 mg | INTRAMUSCULAR | Status: DC | PRN

## 2016-11-12 MED ORDER — OXYTOCIN 40 UNITS IN LACTATED RINGERS INFUSION - SIMPLE MED
2.5000 [IU]/h | INTRAVENOUS | Status: DC
  Administered 2016-11-12: 2.5 [IU]/h via INTRAVENOUS

## 2016-11-12 MED ORDER — PENICILLIN G POTASSIUM 5000000 UNITS IJ SOLR
5.0000 10*6.[IU] | Freq: Once | INTRAVENOUS | Status: AC
  Administered 2016-11-12: 5 10*6.[IU] via INTRAVENOUS
  Filled 2016-11-12: qty 5

## 2016-11-12 MED ORDER — FENTANYL 2.5 MCG/ML BUPIVACAINE 1/10 % EPIDURAL INFUSION (WH - ANES)
14.0000 mL/h | INTRAMUSCULAR | Status: DC | PRN
  Administered 2016-11-12 (×2): 14 mL/h via EPIDURAL
  Filled 2016-11-12 (×2): qty 100

## 2016-11-12 MED ORDER — OXYTOCIN BOLUS FROM INFUSION
500.0000 mL | Freq: Once | INTRAVENOUS | Status: AC
  Administered 2016-11-12: 500 mL via INTRAVENOUS

## 2016-11-12 MED ORDER — LACTATED RINGERS IV SOLN: 500.0000 mL | INTRAVENOUS | Status: DC | PRN

## 2016-11-12 MED ORDER — PENICILLIN G POT IN DEXTROSE 60000 UNIT/ML IV SOLN
3.0000 10*6.[IU] | INTRAVENOUS | Status: DC
  Administered 2016-11-12: 3 10*6.[IU] via INTRAVENOUS
  Filled 2016-11-12 (×4): qty 50

## 2016-11-12 MED ORDER — SOD CITRATE-CITRIC ACID 500-334 MG/5ML PO SOLN: 30.0000 mL | ORAL | Status: DC | PRN

## 2016-11-12 MED ORDER — OXYCODONE-ACETAMINOPHEN 5-325 MG PO TABS: 1.0000 | ORAL_TABLET | ORAL | Status: DC | PRN

## 2016-11-12 MED ORDER — LACTATED RINGERS IV SOLN: 500.0000 mL | Freq: Once | INTRAVENOUS | Status: DC

## 2016-11-12 MED ORDER — IBUPROFEN 600 MG PO TABS
600.0000 mg | ORAL_TABLET | Freq: Four times a day (QID) | ORAL | Status: DC
  Administered 2016-11-12 – 2016-11-14 (×6): 600 mg via ORAL
  Filled 2016-11-12 (×6): qty 1

## 2016-11-12 NOTE — Progress Notes (Signed)
S: Patient seen & examined for progress of labor. Patient comfortable with epidural. Denies PIH symptoms   O:  Vitals:   11/12/16 1632 11/12/16 1701 11/12/16 1731 11/12/16 1801  BP: 121/77 118/79 119/81 123/80  Pulse: 70 71 73 76  Resp:  17 17 18   Temp:      TempSrc:      SpO2:      Weight:      Height:        Dilation: 4 Effacement (%): 50 Cervical Position: Middle Station: -2 Presentation: Vertex Exam by:: Pincus BadderK. Shaw CNM   FHT: Cat I tracing TOCO: q 2-633min   A/P: Continue expectant management Anticipate SVD  PGY-3

## 2016-11-12 NOTE — Anesthesia Pain Management Evaluation Note (Signed)
  CRNA Pain Management Visit Note  Patient: Shelley BasemanHeather M Rojas, 35 y.o., female  "Hello [facility] am a [facility]. We have an anesthesia team available at all times to provide care throughout the hospital, including epidural management and anesthesia for C-section. I don't know your plan for the delivery whether it a natural birth, water birth, IV sedation, nitrous supplementation, doula or epidural, but we want to meet your pain goals."   1.Was your pain managed to your expectations on prior hospitalizations?   Yes   2.What is your expectation for pain management during this hospitalization?     Epidural  3.How can we help you reach that goal? Place epidural when needed.  Record the patient's initial score and the patient's pain goal.   Pain: 0  Pain Goal: 5 [facility] wants you to be able to say your pain was always managed very well.  [name] 11/12/2016

## 2016-11-12 NOTE — Anesthesia Procedure Notes (Signed)
Epidural Patient location during procedure: OB Start time: 11/12/2016 1:53 PM End time: 11/12/2016 1:56 PM  Staffing Anesthesiologist: Leilani AbleHATCHETT, Brynli Ollis Performed: anesthesiologist   Preanesthetic Checklist Completed: patient identified, surgical consent, pre-op evaluation, timeout performed, IV checked, risks and benefits discussed and monitors and equipment checked  Epidural Patient position: sitting Prep: site prepped and draped and DuraPrep Patient monitoring: continuous pulse ox and blood pressure Approach: midline Location: L3-L4 Injection technique: LOR air  Needle:  Needle type: Tuohy  Needle gauge: 17 G Needle length: 9 cm and 9 Needle insertion depth: 5 cm cm Catheter type: closed end flexible Catheter size: 19 Gauge Catheter at skin depth: 10 cm Test dose: negative and Other  Assessment Sensory level: T9 Events: blood not aspirated, injection not painful, no injection resistance, negative IV test and no paresthesia  Additional Notes Reason for block:procedure for pain

## 2016-11-12 NOTE — Progress Notes (Signed)
Shelley Rojas is a 35 y.o. G3P2002 at 551w3d by admitted for induction of labor due to chronic Hypertension.  Subjective: Patient seen and examined. Comfortable with epidural, but not progressing. Proceed with AROM.  Objective: BP 116/76   Pulse 78   Temp 98.6 F (37 C) (Oral)   Resp 18   Ht 5\' 8"  (1.727 m)   Wt 185 lb (83.9 kg)   LMP 02/17/2016 (Approximate)   SpO2 99%   BMI 28.13 kg/m  No intake/output data recorded. No intake/output data recorded.  FHT:  FHR: 130 bpm, variability: moderate,  accelerations:  Present,  decelerations:  Absent UC:   regular, every 1-2 minutes SVE:   Dilation: 4 Effacement (%): 50 Station: -2 Exam by:: Shelley Rojas Rojas- AROM for copious clear fluid  Labs: Lab Results  Component Value Date   WBC 10.2 11/12/2016   HGB 10.9 (L) 11/12/2016   HCT 32.6 (L) 11/12/2016   MCV 87.6 11/12/2016   PLT 256 11/12/2016    Assessment / Plan: IOL process Labor:  s/p AROM Fetal Wellbeing:  Category I Pain Control:  Epidural Anticipated MOD:  NSVD  Clearance Shelley Rojas 11/12/2016, 4:16 PM   I have seen and examined this patient and I agree with the above. Shelley Rojas 5:36 PM 11/12/2016

## 2016-11-12 NOTE — H&P (Signed)
LABOR AND DELIVERY ADMISSION HISTORY AND PHYSICAL NOTE  Shelley Rojas is a 35 y.o. female 263P2002 with IUP at 5931w3d by LMP presenting for IOL for cHTN.   She reports positive fetal movement. She denies leakage of fluid or vaginal bleeding.  Prenatal History/Complications:  Past Medical History: Past Medical History:  Diagnosis Date  . Hypertension     Past Surgical History: Past Surgical History:  Procedure Laterality Date  . NO PAST SURGERIES      Obstetrical History: OB History    Gravida Para Term Preterm AB Living   3 2 2  0 0 2   SAB TAB Ectopic Multiple Live Births   0 0 0 0        Social History: Social History   Social History  . Marital status: Married    Spouse name: N/A  . Number of children: N/A  . Years of education: N/A   Social History Main Topics  . Smoking status: Current Every Day Smoker    Packs/day: 0.50  . Smokeless tobacco: Never Used  . Alcohol use Yes  . Drug use: No  . Sexual activity: Yes    Birth control/ protection: None   Other Topics Concern  . None   Social History Narrative  . None    Family History: Family History  Problem Relation Age of Onset  . Early death Mother     mva  . Heart disease Father   . Hyperlipidemia Father   . Arthritis Paternal Grandmother   . Hyperlipidemia Paternal Grandmother   . Hyperlipidemia Paternal Grandfather     Allergies: No Known Allergies  Prescriptions Prior to Admission  Medication Sig Dispense Refill Last Dose  . Diphenhydramine-APAP, sleep, (TYLENOL PM EXTRA STRENGTH) 50-1000 MG/30ML LIQD Take by mouth.   11/11/2016 at Unknown time  . labetalol (NORMODYNE) 200 MG tablet Take 1 tablet (200 mg total) by mouth 2 (two) times daily. 60 tablet 3 11/11/2016 at 2000  . Prenatal Vit-Fe Phos-FA-Omega (VITAFOL GUMMIES) 3.33-0.333-34.8 MG CHEW Chew 3 tablets by mouth daily. 90 tablet 12 11/11/2016 at Unknown time  . aspirin 81 MG chewable tablet Chew 1 tablet (81 mg total) by mouth  daily. (Patient not taking: Reported on 11/12/2016) 30 tablet 9 Not Taking at Unknown time  . Doxylamine-Pyridoxine (DICLEGIS) 10-10 MG TBEC Take 1 tablet with breakfast and lunch.  Take 2 tablets at bedtime. (Patient not taking: Reported on 11/09/2016) 100 tablet 4 Not Taking  . fluticasone (FLONASE) 50 MCG/ACT nasal spray Place 2 sprays into both nostrils daily. (Patient not taking: Reported on 11/09/2016) 16 g 2 Not Taking  . loratadine (CLARITIN) 10 MG tablet Take 1 tablet (10 mg total) by mouth daily. (Patient not taking: Reported on 11/09/2016) 30 tablet 7 Not Taking  . metroNIDAZOLE (METROGEL VAGINAL) 0.75 % vaginal gel Place 1 Applicatorful vaginally 2 (two) times daily. (Patient not taking: Reported on 11/09/2016) 70 g 0 Not Taking  . omeprazole (PRILOSEC) 20 MG capsule Take 1 capsule (20 mg total) by mouth 2 (two) times daily before a meal. (Patient not taking: Reported on 11/12/2016) 60 capsule 5 Not Taking at Unknown time     Review of Systems   All systems reviewed and negative except as stated in HPI  Blood pressure 125/87, pulse 85, temperature 98.3 F (36.8 C), temperature source Oral, resp. rate 16, height 5\' 8"  (1.727 m), weight 185 lb (83.9 kg), last menstrual period 02/17/2016. General appearance: alert, cooperative and appears stated age Lungs: clear to auscultation  bilaterally Heart: regular rate and rhythm Abdomen: soft, non-tender; bowel sounds normal Extremities: No calf swelling or tenderness Presentation: cephalic Fetal monitoring: Cat Rojas tracing Uterine activity: irregular Dilation: 2 Effacement (%): 50 Station: -2 Exam by:: Shelley ShaveYancey Luft RN    Prenatal labs: ABO, Rh: --/--/A POS (12/04 0758) Antibody: NEG (12/04 0758) Rubella: !Error! RPR: Non Reactive (09/29 1055)  HBsAg: Negative (05/25 1324)  HIV: Non Reactive (09/29 1055)  GBS: Positive (11/16 0000)  1 hr Glucola: 3rd trimester 79/164/123 Genetic screening:  Normal Anatomy US: normal  Prenatal Transfer  Tool  Maternal Diabetes: No Genetic Screening: Normal Maternal Ultrasounds/Referrals: Normal Fetal Ultrasounds or other Referrals:  None Maternal Substance Abuse:  No Significant Maternal Medications:  Meds include: Other: Labetalol 200mg  BID Significant Maternal Lab Results: Lab values include: Group B Strep positive  Results for orders placed or performed during the hospital encounter of 11/12/16 (from the past 24 hour(s))  CBC   Collection Time: 11/12/16  7:58 AM  Result Value Ref Range   WBC 10.2 4.0 - 10.5 K/uL   RBC 3.72 (L) 3.87 - 5.11 MIL/uL   Hemoglobin 10.9 (L) 12.0 - 15.0 g/dL   HCT 16.132.6 (L) 09.636.0 - 04.546.0 %   MCV 87.6 78.0 - 100.0 fL   MCH 29.3 26.0 - 34.0 pg   MCHC 33.4 30.0 - 36.0 g/dL   RDW 40.913.7 81.111.5 - 91.415.5 %   Platelets 256 150 - 400 K/uL  Type and screen [facility]   Collection Time: 11/12/16  7:58 AM  Result Value Ref Range   ABO/RH(D) A POS    Antibody Screen NEG    Sample Expiration 11/15/2016     Patient Active Problem List   Diagnosis Date Noted  . Hypertension affecting pregnancy 11/12/2016  . GBS bacteriuria 10/01/2016  . Chronic hypertension affecting pregnancy 10/01/2016  . Supervision of high-risk pregnancy 05/03/2016    Assessment: Shelley Rojas is a 35 y.o. G3P2002 at 5339w3d here for IOL for cHTN  #Labor:Anticipate SVD. Cytotec x 1 then pitocin for induction of labor #Pain: IV pan meds prn/epidural on request #FWB: Cat Rojas tracing #ID:  GBS positive: PCN intrapartum #MOF: Breast  #MOC:Nexplanon #Circ:  Shelley Citronn/a  WALLACE, NOAH I, DO PGY-3 11/12/2016, 10:00 AM   CNM attestation:  Rojas have seen and examined this patient; Rojas agree with above documentation in the resident's note.   Shelley Rojas is a 35 y.o. [id number] here for IOL for cHTN (takes Lab 200bid)  PE: BP 109/73   Pulse 70   Temp 98.6 F (37 C) (Oral)   Resp 16   Ht 5\' 8"  (1.727 m)   Wt 83.9 kg (185 lb)   LMP 02/17/2016 (Approximate)   SpO2 99%   BMI  28.13 kg/m  Gen: calm comfortable, NAD Resp: normal effort, no distress Abd: gravid  ROS, labs, PMH reviewed  Plan: [facility] Cytotec x 1 then eval for Pit PCN for GBS ppx Anticipate SVD  Sumaiyah Markert CNM 11/12/2016, 3:47 PM

## 2016-11-12 NOTE — Anesthesia Preprocedure Evaluation (Signed)
Anesthesia Evaluation  Patient identified by MRN, date of birth, ID band Patient awake    Reviewed: Allergy & Precautions, H&P , NPO status , Patient's Chart, lab work & pertinent test results  Airway Mallampati: I  TM Distance: >3 FB Neck ROM: full    Dental no notable dental hx.    Pulmonary Current Smoker,    Pulmonary exam normal        Cardiovascular hypertension, Pt. on home beta blockers Normal cardiovascular exam     Neuro/Psych negative neurological ROS  negative psych ROS   GI/Hepatic negative GI ROS, Neg liver ROS,   Endo/Other  negative endocrine ROS  Renal/GU negative Renal ROS     Musculoskeletal   Abdominal Normal abdominal exam  (+)   Peds  Hematology negative hematology ROS (+)   Anesthesia Other Findings   Reproductive/Obstetrics (+) Pregnancy                             Anesthesia Physical Anesthesia Plan  ASA: II  Anesthesia Plan: Epidural   Post-op Pain Management:    Induction:   Airway Management Planned:   Additional Equipment:   Intra-op Plan:   Post-operative Plan:   Informed Consent: I have reviewed the patients History and Physical, chart, labs and discussed the procedure including the risks, benefits and alternatives for the proposed anesthesia with the patient or authorized representative who has indicated his/her understanding and acceptance.     Plan Discussed with:   Anesthesia Plan Comments:         Anesthesia Quick Evaluation

## 2016-11-12 NOTE — Progress Notes (Signed)
S: Patient seen & examined for progress of labor. Patient comfortable.Requesting epidural. No PIH symptoms  O:  Vitals:   11/12/16 1217 11/12/16 1344 11/12/16 1357 11/12/16 1358  BP: 122/84 (!) 128/92  139/85  Pulse: 76 84 83 82  Resp: 18 18  20   Temp: 98.6 F (37 C)     TempSrc: Oral     SpO2:   99%   Weight:      Height:        Dilation: 4 Effacement (%): 50 Station: -2 Presentation: Vertex Exam by:: Gifford ShaveYancey Luft RN    FHT: Cat I tracing TOCO: q2-263min   A/P: Epidural to be placed Continue expectant management Anticipate SVD  Deforest HoylesNoah Wallace, DO PGY-3

## 2016-11-13 LAB — RPR: RPR: NONREACTIVE

## 2016-11-13 MED ORDER — OXYCODONE-ACETAMINOPHEN 5-325 MG PO TABS
1.0000 | ORAL_TABLET | ORAL | Status: DC | PRN
  Administered 2016-11-13 – 2016-11-14 (×4): 1 via ORAL
  Filled 2016-11-13 (×4): qty 1

## 2016-11-13 MED ORDER — MEASLES, MUMPS & RUBELLA VAC ~~LOC~~ INJ
0.5000 mL | INJECTION | Freq: Once | SUBCUTANEOUS | Status: AC
  Administered 2016-11-14: 0.5 mL via SUBCUTANEOUS
  Filled 2016-11-13: qty 0.5

## 2016-11-13 MED ORDER — OXYCODONE-ACETAMINOPHEN 5-325 MG PO TABS: 2.0000 | ORAL_TABLET | ORAL | Status: DC | PRN

## 2016-11-13 NOTE — Plan of Care (Signed)
Problem: Pain Management: Goal: General experience of comfort will improve and pain level will decrease Outcome: Progressing Mother was experiencing uterine cramping unrelieved by Ibuprofen and Tylenol. Order obtained for Percocet which has helped to decrease patient's pain.

## 2016-11-13 NOTE — Progress Notes (Signed)
CM / UR chart review completed.  

## 2016-11-13 NOTE — Progress Notes (Signed)
Post Partum Day #1 Subjective: no complaints, up ad lib, voiding and tolerating PO  Objective: Blood pressure 110/77, pulse 68, temperature 98.5 F (36.9 C), temperature source Oral, resp. rate 18, height '5\' 8"'  (1.727 m), weight 185 lb (83.9 kg), last menstrual period 02/17/2016, SpO2 99 %, unknown if currently breastfeeding.  Physical Exam:  General: alert, cooperative and no distress Lochia: appropriate Uterine Fundus: firm Incision: none DVT Evaluation: No evidence of DVT seen on physical exam. No cords or calf tenderness. No significant calf/ankle edema.   Recent Labs  11/12/16 0758  HGB 10.9*  HCT 32.6*    Assessment/Plan: Plan for discharge tomorrow and Contraception Nexplanon planned. Needs MMR postpartum.  CHTN: B/P's stable this am.    LOS: 1 day   Morene Crocker, CNM 11/13/2016, 7:16 AM

## 2016-11-13 NOTE — Anesthesia Postprocedure Evaluation (Signed)
Anesthesia Post Note  Patient: Shelley BasemanHeather M Rojas  Procedure(s) Performed: * No procedures listed *  Patient location during evaluation: Mother Baby Anesthesia Type: Epidural Level of consciousness: awake and alert Pain management: pain level controlled Vital Signs Assessment: post-procedure vital signs reviewed and stable Respiratory status: spontaneous breathing, nonlabored ventilation and respiratory function stable Cardiovascular status: stable Postop Assessment: no headache, no backache and epidural receding Anesthetic complications: no     Last Vitals:  Vitals:   11/12/16 2307 11/13/16 0322  BP: 130/88 110/77  Pulse: 72 68  Resp: 18 18  Temp: 36.8 C 36.9 C    Last Pain:  Vitals:   11/13/16 0515  TempSrc:   PainSc: 4    Pain Goal:                 Junious SilkGILBERT,Avory Rahimi

## 2016-11-14 MED ORDER — IBUPROFEN 600 MG PO TABS: 600.0000 mg | ORAL_TABLET | Freq: Four times a day (QID) | ORAL | 2 refills | Status: DC

## 2016-11-14 MED ORDER — SENNOSIDES-DOCUSATE SODIUM 8.6-50 MG PO TABS: 2.0000 | ORAL_TABLET | Freq: Every day | ORAL | 1 refills | Status: DC

## 2016-11-14 MED ORDER — OXYCODONE-ACETAMINOPHEN 5-325 MG PO TABS: 2.0000 | ORAL_TABLET | ORAL | 0 refills | Status: DC | PRN

## 2016-11-14 NOTE — Discharge Instructions (Signed)
Postpartum Hypertension  Postpartum hypertension is high blood pressure after pregnancy that remains higher than normal for more than two days after delivery. You may not realize that you have postpartum hypertension if your blood pressure is not being checked regularly. In some cases, postpartum hypertension will go away on its own, usually within a week of delivery. However, for some women, medical treatment is required to prevent serious complications, such as seizures or stroke.  The following things can affect your blood pressure:  · The type of delivery you had.  · Having received IV fluids or other medicines during or after delivery.    What are the causes?  Postpartum hypertension may be caused by any of the following or by a combination of any of the following:  · Hypertension that existed before pregnancy (chronic hypertension).  · Gestational hypertension.  · Preeclampsia or eclampsia.  · Receiving a lot of fluid through an IV during or after delivery.  · Medicines.  · HELLP syndrome.  · Hyperthyroidism.  · Stroke.  · Other rare neurological or blood disorders.    In some cases, the cause may not be known.  What increases the risk?  Postpartum hypertension can be related to one or more risk factors, such as:  · Chronic hypertension. In some cases, this may not have been diagnosed before pregnancy.  · Obesity.  · Type 2 diabetes.  · Kidney disease.  · Family history of preeclampsia.  · Other medical conditions that cause hormonal imbalances.    What are the signs or symptoms?  As with all types of hypertension, postpartum hypertension may not have any symptoms. Depending on how high your blood pressure is, you may experience:  · Headaches. These may be mild, moderate, or severe. They may also be steady, constant, or sudden in onset (thunderclap headache).  · Visual changes.  · Dizziness.  · Shortness of breath.  · Swelling of your hands, feet, lower legs, or face. In some cases, you may have swelling in  more than one of these locations.  · Heart palpitations or a racing heartbeat.  · Difficulty breathing while lying down.  · Decreased urination.    Other rare signs and symptoms may include:  · Sweating more than usual. This lasts longer than a few days after delivery.  · Chest pain.  · Sudden dizziness when you get up from sitting or lying down.  · Seizures.  · Nausea or vomiting.  · Abdominal pain.    How is this diagnosed?  The diagnosis of postpartum hypertension is made through a combination of physical examination findings and testing of your blood and urine. You may also have additional tests, such as a CT scan or an MRI, to check for other complications of postpartum hypertension.  How is this treated?  When blood pressure is high enough to require treatment, your options may include:  · Medicines to reduce blood pressure (antihypertensives). Tell your health care provider if you are breastfeeding or if you plan to breastfeed. There are many antihypertensive medicines that are safe to take while breastfeeding.  · Stopping medicines that may be causing hypertension.  · Treating medical conditions that are causing hypertension.  · Treating the complications of hypertension, such as seizures, stroke, or kidney problems.    Your health care provider will also continue to monitor your blood pressure closely and repeatedly until it is within a safe range for you.  Follow these instructions at home:  · Take medicines only   as directed by your health care provider.  · Get regular exercise after your health care provider tells you that it is safe.  · Follow your health care provider’s recommendations on fluid and salt restrictions.  · Do not use any tobacco products, including cigarettes, chewing tobacco, or electronic cigarettes. If you need help quitting, ask your health care provider.  · Keep all follow-up visits as directed by your health care provider. This is important.  Contact a health care provider  if:  · Your symptoms get worse.  · You have new symptoms, such as:  ? Headache.  ? Dizziness.  ? Visual changes.  Get help right away if:  · You develop a severe or sudden headache.  · You have seizures.  · You develop numbness or weakness on one side of your body.  · You have difficulty thinking, speaking, or swallowing.  · You develop severe abdominal pain.  · You develop difficulty breathing, chest pain, a racing heartbeat, or heart palpitations.  These symptoms may represent a serious problem that is an emergency. Do not wait to see if the symptoms will go away. Get medical help right away. Call your local emergency services (911 in the [place] Do not drive yourself to the hospital.  This information is not intended to replace advice given to you by your health care provider. Make sure you discuss any questions you have with your health care provider.  Document Released: 07/30/2014 Document Revised: 04/30/2016 Document Reviewed: 06/10/2014  Elsevier Interactive Patient Education © 2017 [employer]

## 2016-11-14 NOTE — Progress Notes (Signed)
Post Partum Day #2 Subjective: no complaints, up ad lib, voiding and tolerating PO  Objective: Blood pressure 116/73, pulse (!) 58, temperature 98.1 F (36.7 C), temperature source Oral, resp. rate 18, height 5\' 8"  (1.727 m), weight 185 lb (83.9 kg), last menstrual period 02/17/2016, SpO2 96 %, unknown if currently breastfeeding.  Physical Exam:  General: alert, cooperative and no distress Lochia: appropriate Uterine Fundus: firm Incision: none DVT Evaluation: No evidence of DVT seen on physical exam. No cords or calf tenderness. No significant calf/ankle edema.   Recent Labs  11/12/16 0758  HGB 10.9*  HCT 32.6*    Assessment/Plan: Discharge home and Contraception Nexplanon planned CHTN: blood pressures stable, was on Labetalol antepartum.     LOS: 2 days   Roe Coombsachelle A Seanmichael Salmons, CNM 11/14/2016, 7:32 AM

## 2016-11-14 NOTE — Discharge Summary (Signed)
Obstetric Discharge Summary Reason for Admission: induction of labor and at 1652w3d for CHTN on labetalol Prenatal Procedures: NST and ultrasound Intrapartum Procedures: spontaneous vaginal delivery Postpartum Procedures: none and Rubella Ig Complications-Operative and Postpartum: none Hemoglobin  Date Value Ref Range Status  11/12/2016 10.9 (L) 12.0 - 15.0 g/dL Final   HCT  Date Value Ref Range Status  11/12/2016 32.6 (L) 36.0 - 46.0 % Final   Hematocrit  Date Value Ref Range Status  09/07/2016 35.6 34.0 - 46.6 % Final    Physical Exam:  General: alert, cooperative and no distress Lochia: appropriate Uterine Fundus: firm Incision: none DVT Evaluation: No evidence of DVT seen on physical exam. No cords or calf tenderness. No significant calf/ankle edema.  Discharge Diagnoses: Term Pregnancy-delivered and CHTN  Discharge Information: Date: 11/14/2016 Activity: pelvic rest Diet: routine Medications: PNV, Ibuprofen, Colace and Percocet; no previous HTN medications prior to pregnancy.  Condition: stable Instructions: refer to practice specific booklet Discharge to: home Follow-up Information    Rachelle A Denney, CNM Follow up in 4 week(s).   Specialty:  Certified Nurse Midwife Why:  Order Nexplanon @ 4 week postpartum visit.  Contact information: [address] [place] [id number] [phone]           Newborn Data: Live born female  Birth Weight: 6 lb 11.9 oz (3060 g) APGAR: 9, 9  Home with mother.  Roe Coombsachelle A Denney, CNM 11/14/2016, 7:37 AM

## 2016-12-12 ENCOUNTER — Encounter: Payer: Self-pay | Admitting: Certified Nurse Midwife

## 2016-12-12 ENCOUNTER — Ambulatory Visit (INDEPENDENT_AMBULATORY_CARE_PROVIDER_SITE_OTHER): Payer: Medicaid Other | Admitting: Certified Nurse Midwife

## 2016-12-12 VITALS — BP 131/96 | HR 82 | Temp 98.8°F | Wt 174.0 lb

## 2016-12-12 DIAGNOSIS — Z3202 Encounter for pregnancy test, result negative: Secondary | ICD-10-CM

## 2016-12-12 DIAGNOSIS — Z30019 Encounter for initial prescription of contraceptives, unspecified: Secondary | ICD-10-CM

## 2016-12-12 DIAGNOSIS — I10 Essential (primary) hypertension: Secondary | ICD-10-CM | POA: Insufficient documentation

## 2016-12-12 LAB — POCT URINE PREGNANCY: Preg Test, Ur: NEGATIVE

## 2016-12-12 MED ORDER — AMLODIPINE BESYLATE 5 MG PO TABS: 5.0000 mg | ORAL_TABLET | Freq: Every day | ORAL | 12 refills | Status: DC

## 2016-12-12 NOTE — Progress Notes (Signed)
Subjective:     Shelley Rojas is a 36 y.o. female who presents for a postpartum visit. She is 4 weeks postpartum following a spontaneous vaginal delivery. I have fully reviewed the prenatal and intrapartum course. The delivery was at 824w3d gestational weeks. Outcome: spontaneous vaginal delivery. Anesthesia: epidural. Postpartum course has been normal, elevated blood pressure today. Baby's course has been normal. Baby is feeding by bottle - Similac Advance. Bleeding no bleeding. Bowel function is normal. Bladder function is normal. Patient is sexually active. Contraception method is none. Postpartum depression screening: negative.  Tobacco, alcohol and substance abuse history reviewed.  Adult immunizations reviewed including TDAP, rubella and varicella.  The following portions of the patient's history were reviewed and updated as appropriate: allergies, current medications, past family history, past medical history, past social history, past surgical history and problem list.  Review of Systems Pertinent items noted in HPI and remainder of comprehensive ROS otherwise negative.   Objective:    BP (!) 131/96   Pulse 82   Temp 98.8 F (37.1 C) (Oral)   Wt 174 lb (78.9 kg)   Breastfeeding? No   BMI 26.46 kg/m   General:  alert, cooperative and no distress   Breasts:  inspection negative, no nipple discharge or bleeding, no masses or nodularity palpable  Lungs: clear to auscultation bilaterally  Heart:  regular rate and rhythm, S1, S2 normal, no murmur, click, rub or gallop  Abdomen: soft, non-tender; bowel sounds normal; no masses,  no organomegaly  Pelvic Exam: Not performed.          50% of 20 min visit spent on counseling and coordination of care.   Assessment:     Normal 4 week postpartum exam. Pap smear not done at today's visit.     Contraception management; Nexplanon ordered  Hypertension: Norvasc started  Plan:    1. Contraception: none 2. Last pap smear 05/03/16:normal 3.  Follow up in: 2 weeks For Nexplanon insertion or as needed.  2hr GTT for h/o GDM/screening for DM q 3 yrs per ADA recommendations Preconception counseling provided Healthy lifestyle practices reviewed

## 2016-12-14 ENCOUNTER — Encounter: Payer: Self-pay | Admitting: Certified Nurse Midwife

## 2016-12-23 IMAGING — US US MFM FETAL BPP W/O NON-STRESS
1 series · 12 of 28 positions shown · non-contrast
Comparison: none

[Series 1: us mfm fetal bpp w/o non-stress · 30 acquisitions, 12 frames shown]
[im 2/30]
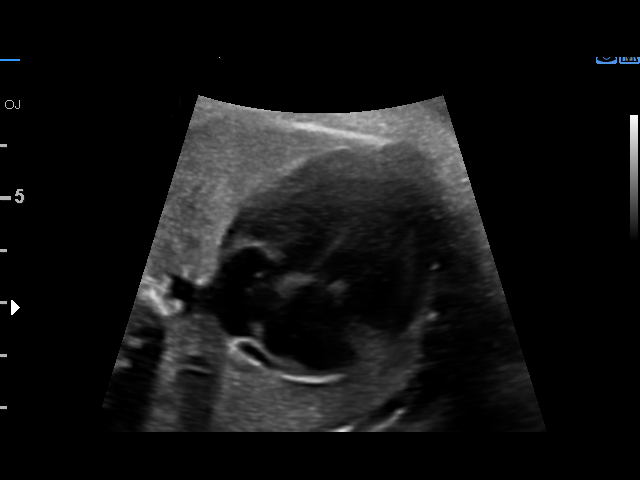
[im 4/30]
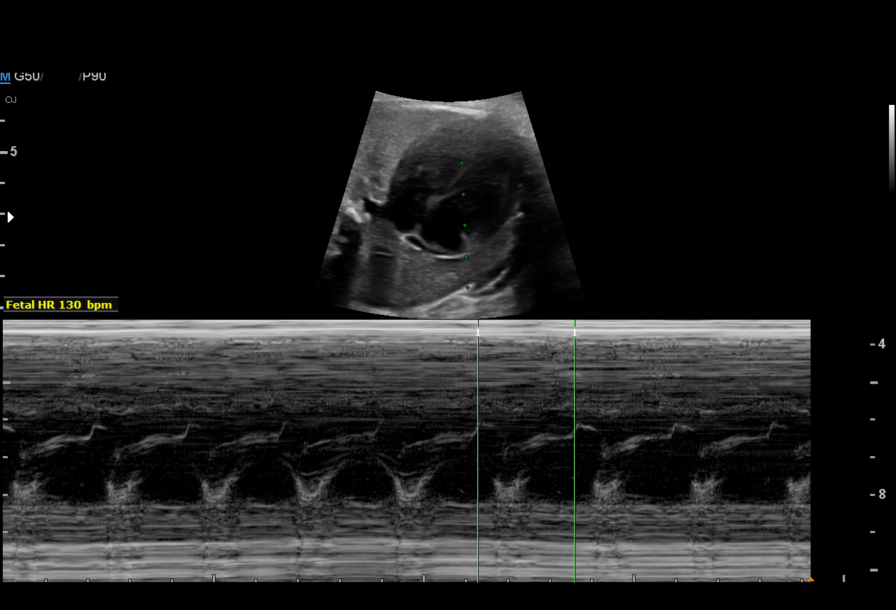
[im 6/30]
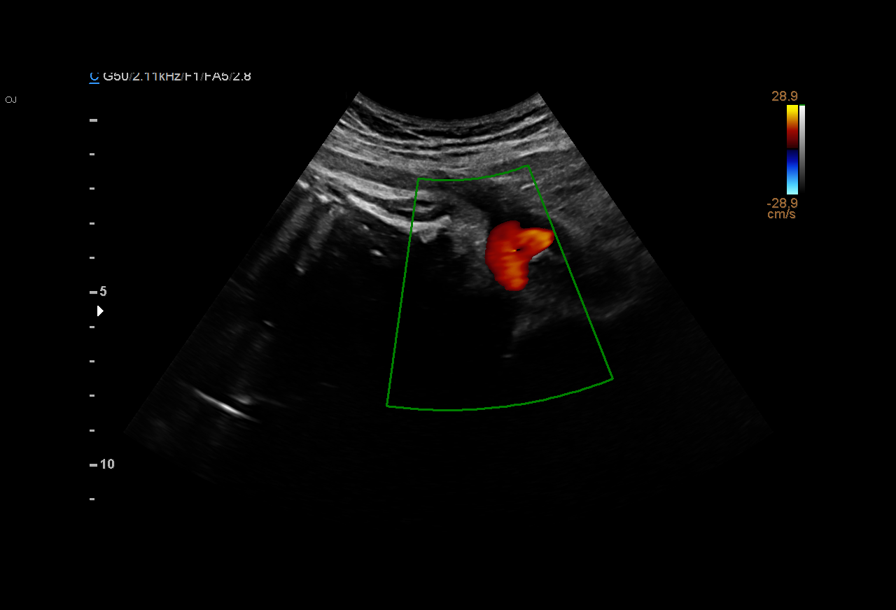
[im 9/30]
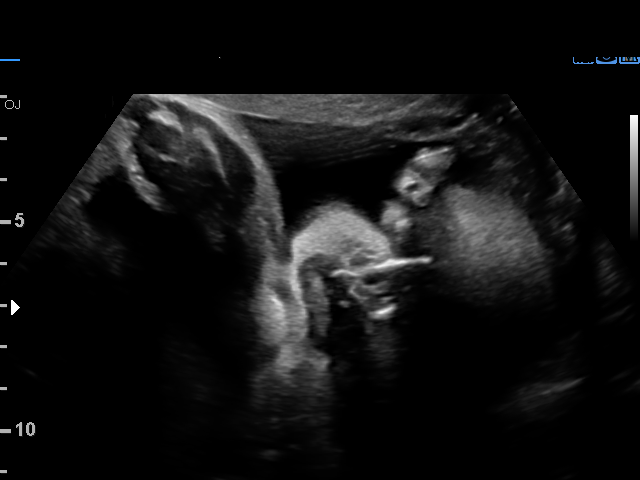
[im 11/30]
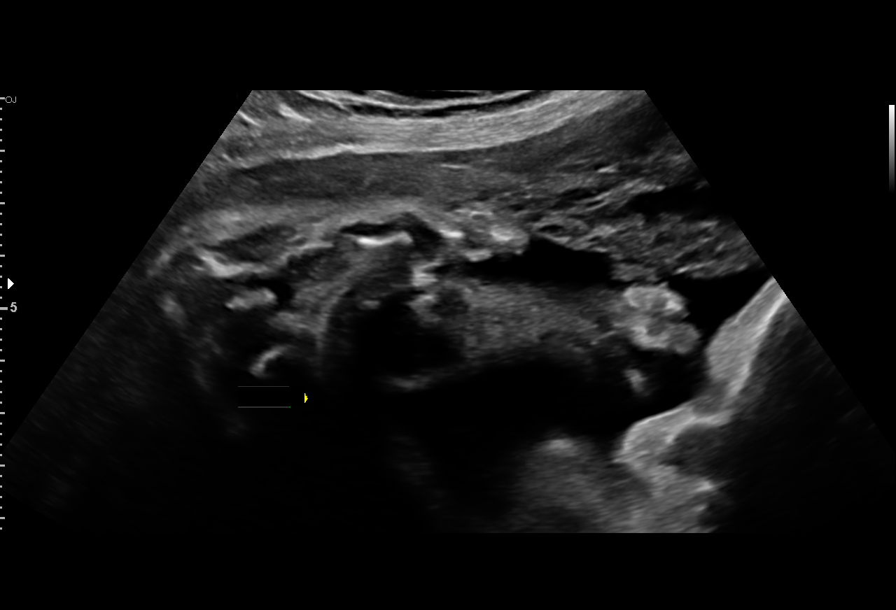
[im 13/30]
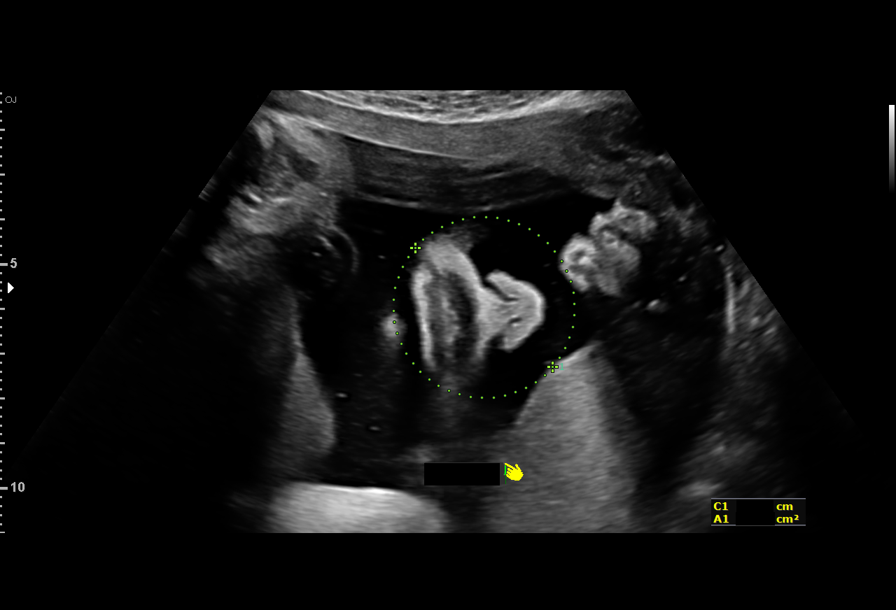
[im 17/30]
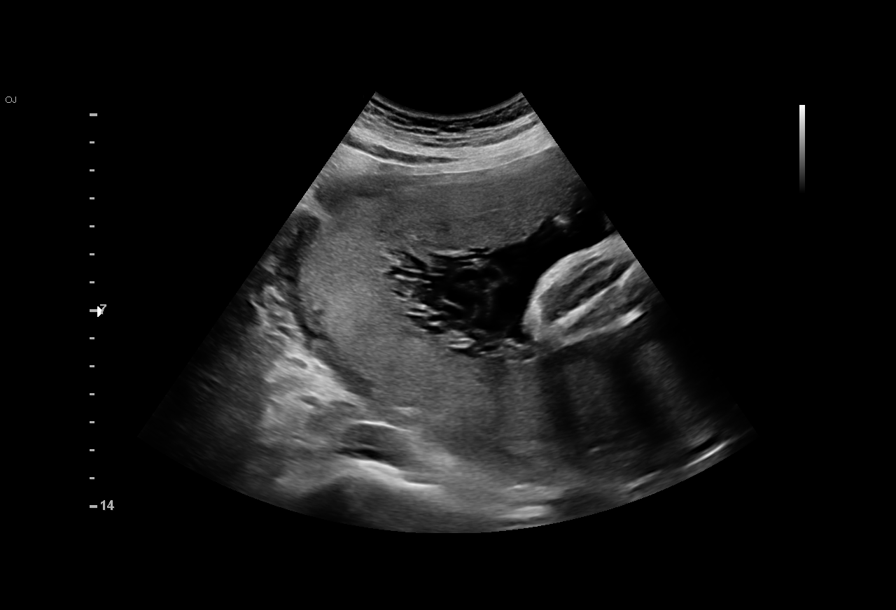
[im 19/30]
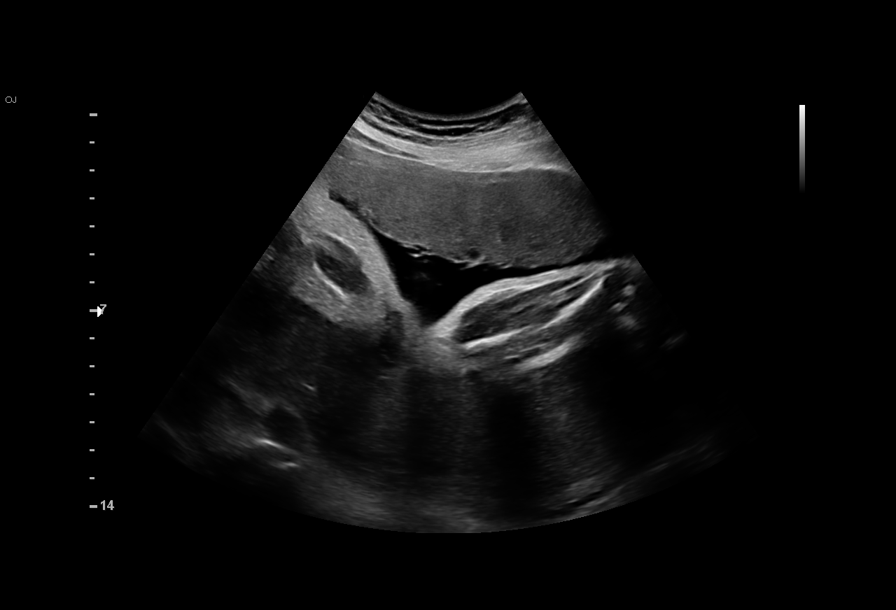
[im 21/30]
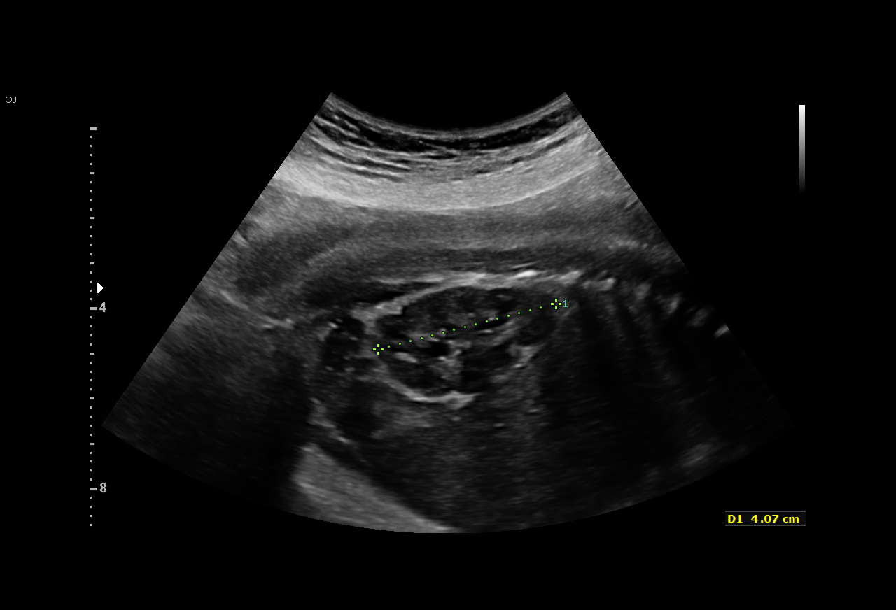
[im 24/30]
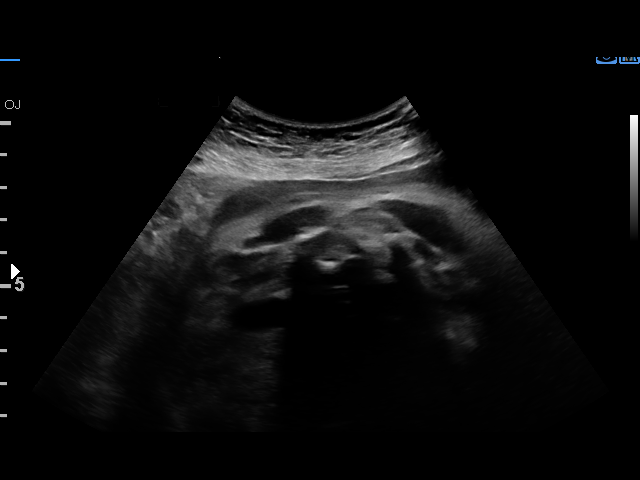
[im 26/30]
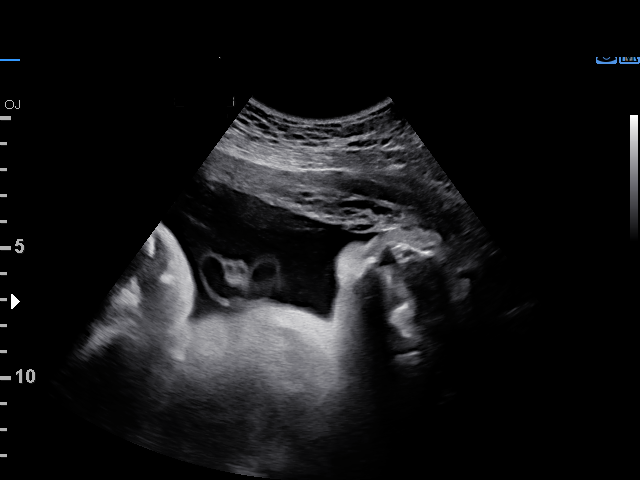
[im 28/30]
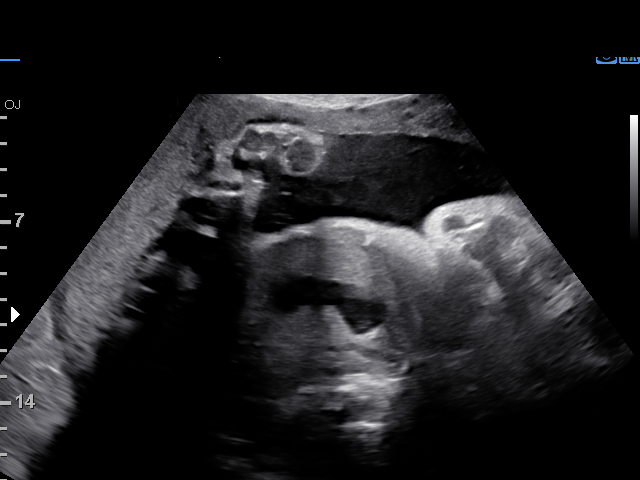

[12 of 28 positions shown; findings below may reference images not displayed]

Road [HOSPITAL]

1  SORIN OXENDINE            [id number]      [id number]     [id number]
Indications

35 weeks gestation of pregnancy
Hypertension - Chronic/Pre-existing -labetalol
Fetal Evaluation

Num Of Fetuses:     1
Fetal Heart         130
Rate(bpm):
Cardiac Activity:   Observed
Presentation:       Cephalic

Amniotic Fluid
AFI FV:      Subjectively within normal limits

AFI Sum(cm)     %Tile       Largest Pocket(cm)
13.99           50

RUQ(cm)                     LUQ(cm)        LLQ(cm)
3.28
Biophysical Evaluation

Amniotic F.V:   Within normal limits       F. Tone:        Observed
F. Movement:    Observed                   Score:          [DATE]
F. Breathing:   Observed
Gestational Age

LMP:           35w 0d        Date:  02/17/16                 EDD:   11/23/16
Best:          35w 0d     Det. By:  LMP  (02/17/16)          EDD:   11/23/16
Impression

IUP at 35+0 weeks with chronic hypertension
Amniotic fluid normal with AFI of 14cm
BPP [DATE]
Recommendations

No evidence of fetal compromise. continue current
antepartum testing regimen

## 2017-01-13 IMAGING — US US MFM FETAL BPP W/O NON-STRESS
1 series · 16 of 22 positions shown · non-contrast
Comparison: none

[Series 1: us mfm fetal bpp w/o non-stress · 22 acquisitions, 16 frames shown]
[im 1/22]
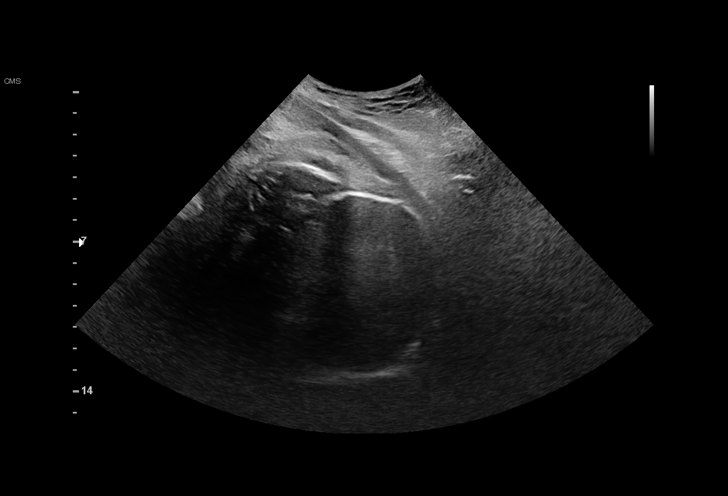
[im 3/22]
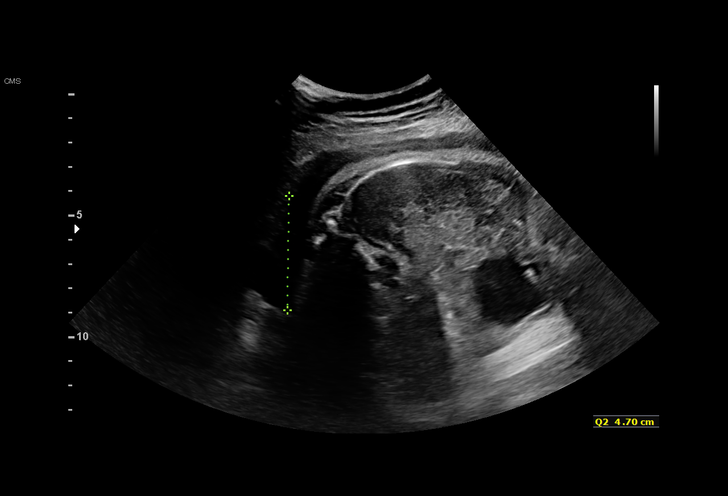
[im 4/22]
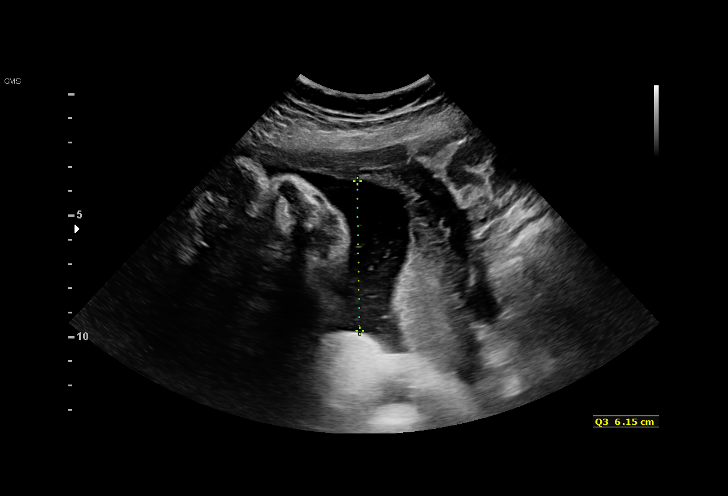
[im 5/22]
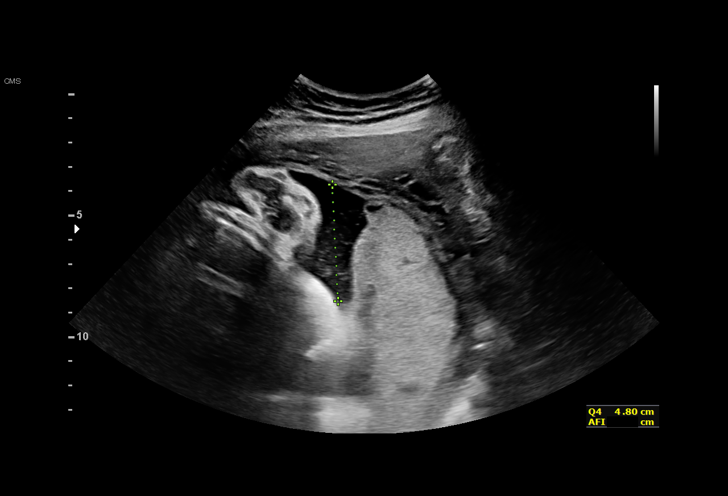
[im 7/22]
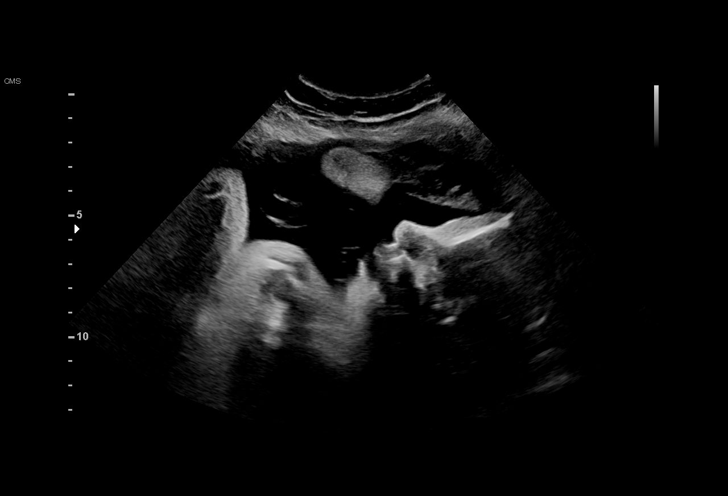
[im 8/22]
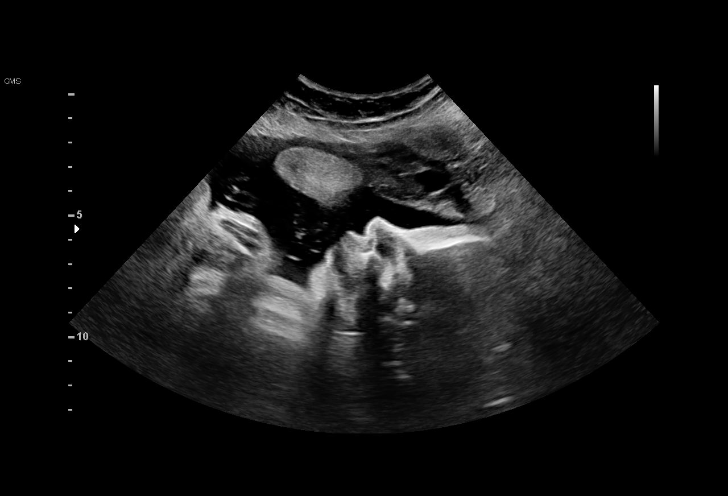
[im 9/22]
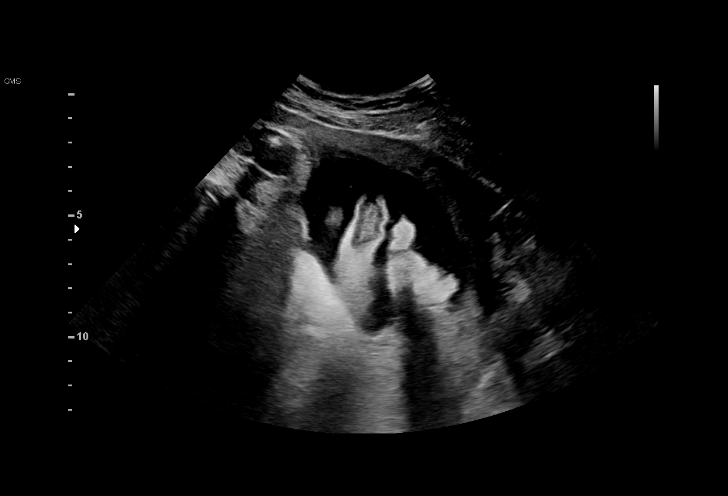
[im 11/22]
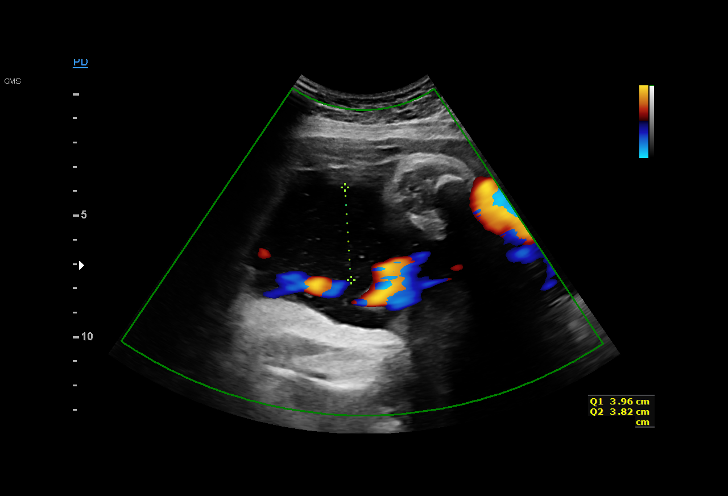
[im 12/22]
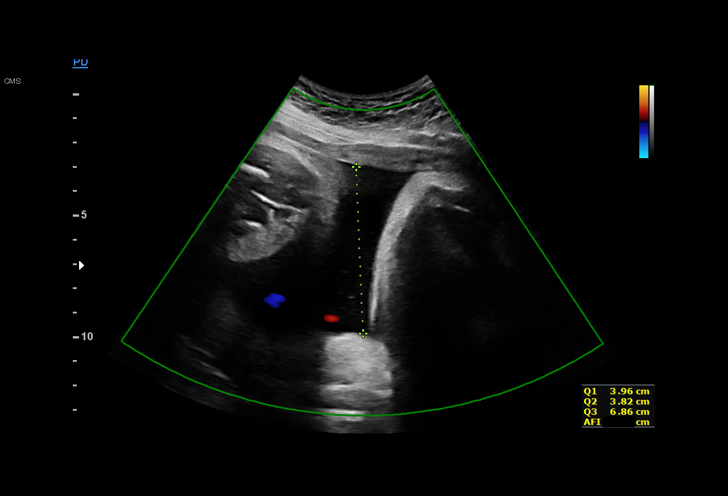
[im 14/22]
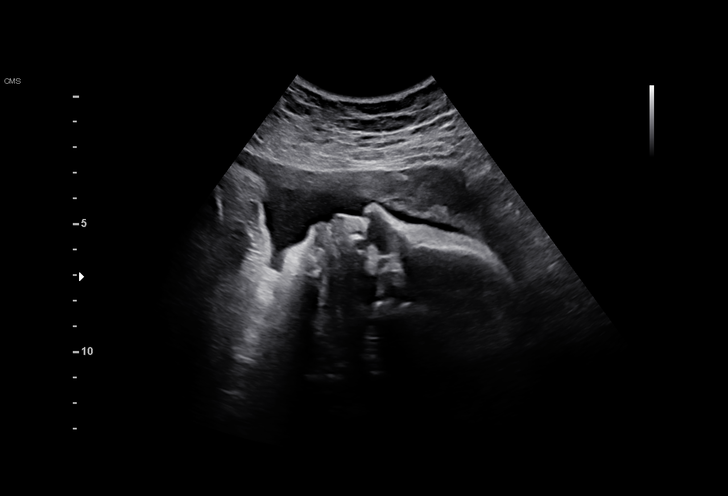
[im 15/22]
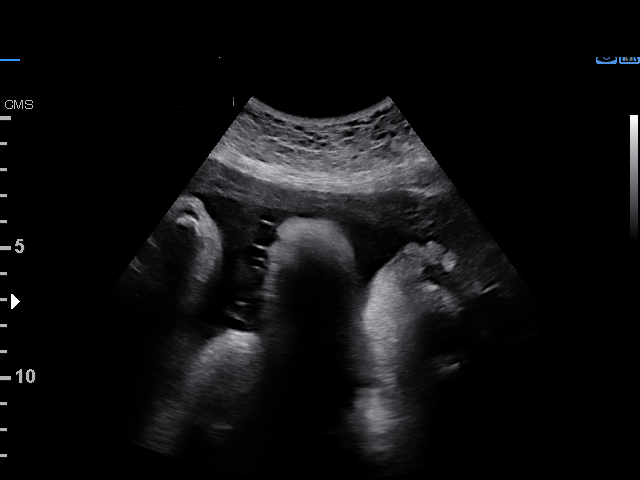
[im 16/22]
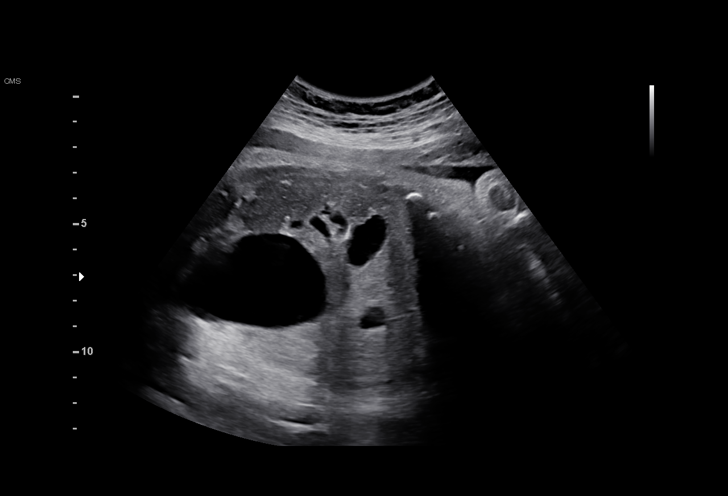
[im 18/22]
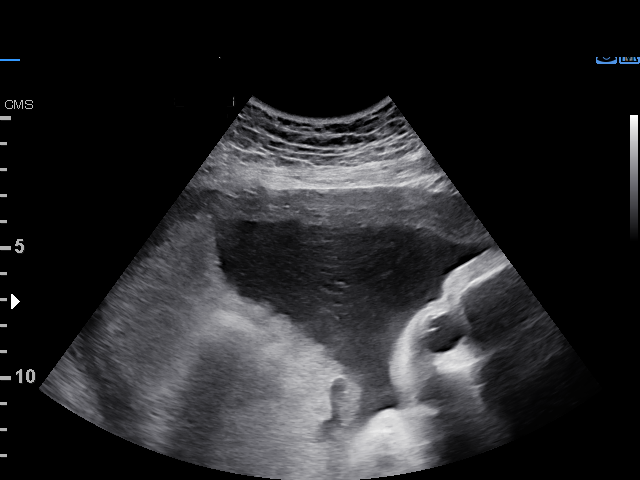
[im 19/22]
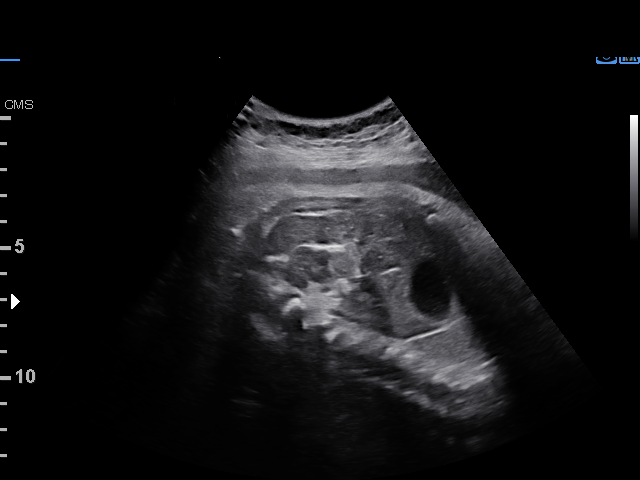
[im 20/22]
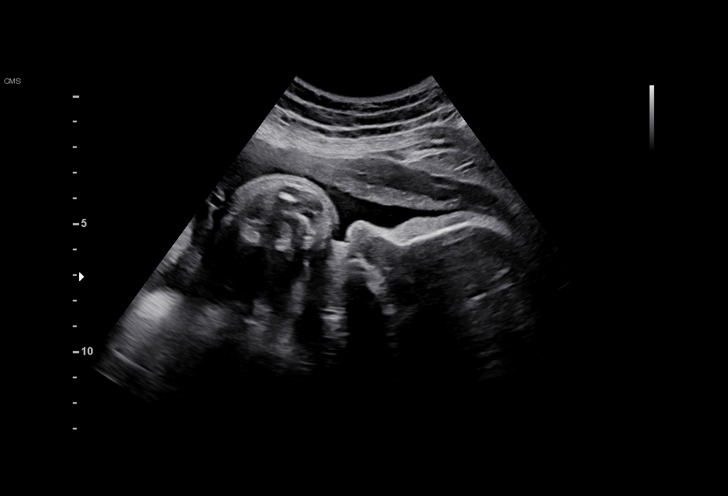
[im 22/22]
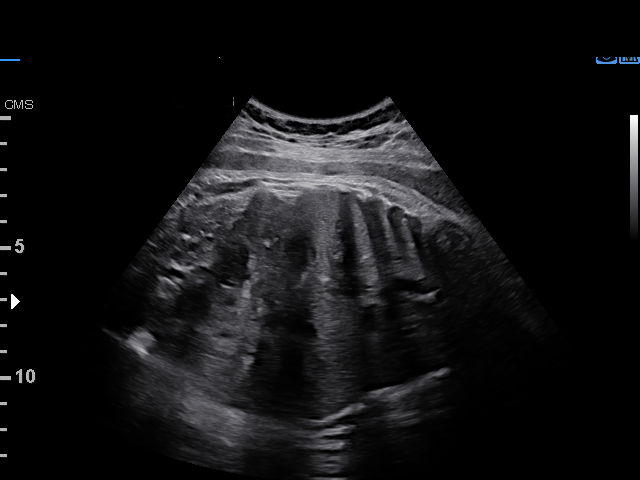

[16 of 22 positions shown; findings below may reference images not displayed]

Road [HOSPITAL]

1  WANDY MORCOS            [id number]      [id number]     [id number]
Indications

38 weeks gestation of pregnancy
Hypertension - Chronic/Pre-existing
(labetalol)
Fetal Evaluation

Num Of Fetuses:     1
Fetal Heart         151
Rate(bpm):
Cardiac Activity:   Observed
Presentation:       Cephalic

Amniotic Fluid
AFI FV:      Subjectively within normal limits

AFI Sum(cm)     %Tile       Largest Pocket(cm)
18.32           72

RUQ(cm)       RLQ(cm)       LUQ(cm)        LLQ(cm)
4.47
Biophysical Evaluation

Amniotic F.V:   Within normal limits       F. Tone:         Observed
F. Movement:    Observed                   Score:           [DATE]
F. Breathing:   Observed
Gestational Age

LMP:           38w 0d        Date:  02/17/16                 EDD:    11/23/16
Best:          38w 0d     Det. By:  LMP  (02/17/16)          EDD:    11/23/16
Impression

SIUP at 38+0 weeks
Cephalic presentation
Normal amniotic fluid volume
BPP [DATE]
Recommendations

Follow-up as clinically indicated

## 2017-02-26 ENCOUNTER — Ambulatory Visit: Payer: Medicaid Other | Admitting: Obstetrics & Gynecology

## 2017-04-22 ENCOUNTER — Encounter: Payer: Self-pay | Admitting: Obstetrics and Gynecology

## 2017-04-22 ENCOUNTER — Ambulatory Visit (INDEPENDENT_AMBULATORY_CARE_PROVIDER_SITE_OTHER): Payer: Medicaid Other | Admitting: Obstetrics and Gynecology

## 2017-04-22 ENCOUNTER — Encounter: Payer: Self-pay | Admitting: *Deleted

## 2017-04-22 VITALS — BP 135/98 | HR 76 | Wt 179.0 lb

## 2017-04-22 DIAGNOSIS — Z30017 Encounter for initial prescription of implantable subdermal contraceptive: Secondary | ICD-10-CM

## 2017-04-22 DIAGNOSIS — Z308 Encounter for other contraceptive management: Secondary | ICD-10-CM | POA: Diagnosis not present

## 2017-04-22 LAB — POCT URINE PREGNANCY: Preg Test, Ur: NEGATIVE

## 2017-04-22 MED ORDER — ETONOGESTREL 68 MG ~~LOC~~ IMPL: 68.0000 mg | DRUG_IMPLANT | Freq: Once | SUBCUTANEOUS | Status: DC

## 2017-04-22 NOTE — Progress Notes (Signed)
Patient presents for Nexplanon Insertion. She had unprotected sex one week ago.  UPT-NEGATIVE

## 2017-04-22 NOTE — Progress Notes (Signed)
Patient given informed consent, signed copy in the chart, time out was performed. Pregnancy test was negative. Appropriate time out taken.  Patient's right arm was prepped and draped in the usual sterile fashion.. The ruler used to measure and mark insertion area.  Patient was prepped with alcohol swab and then injected with 1 cc of 1% lidocaine with epinephrine.  Patient was prepped with betadine, Nexplanon removed form packaging.  Device confirmed in needle, then inserted full length of needle and withdrawn per handbook instructions.  Patient insertion site covered with a band-aid and pressure dressing.   Minimal blood loss.  Patient tolerated the procedure well. Patient advised to use condoms for the next 2 weeks

## 2017-05-20 ENCOUNTER — Other Ambulatory Visit (HOSPITAL_COMMUNITY)
Admission: RE | Admit: 2017-05-20 | Discharge: 2017-05-20 | Disposition: A | Discharge status: Home / Self Care | Payer: Medicaid Other | Source: Ambulatory Visit | Attending: Obstetrics and Gynecology | Admitting: Obstetrics and Gynecology

## 2017-05-20 ENCOUNTER — Ambulatory Visit (INDEPENDENT_AMBULATORY_CARE_PROVIDER_SITE_OTHER): Payer: Medicaid Other | Admitting: Obstetrics and Gynecology

## 2017-05-20 ENCOUNTER — Encounter: Payer: Self-pay | Admitting: Obstetrics and Gynecology

## 2017-05-20 VITALS — BP 136/95 | HR 68 | Ht 68.0 in | Wt 181.2 lb

## 2017-05-20 DIAGNOSIS — Z01419 Encounter for gynecological examination (general) (routine) without abnormal findings: Secondary | ICD-10-CM | POA: Insufficient documentation

## 2017-05-20 DIAGNOSIS — Z308 Encounter for other contraceptive management: Secondary | ICD-10-CM | POA: Diagnosis not present

## 2017-05-20 NOTE — Progress Notes (Signed)
Last Pap 05/03/16 Normal

## 2017-05-20 NOTE — Progress Notes (Signed)
Subjective:     Shelley Rojas is a 36 y.o. female G3P3 with BMI 27 who is here for a comprehensive physical exam. The patient reports no problems. She is sexually active using Nexplanon for contraception. She denies any vaginal bleeding since placement. She is sexually active without complaints. She denies any abnormal discharge or pelvic pain  Past Medical History:  Diagnosis Date  . Hypertension    Past Surgical History:  Procedure Laterality Date  . NO PAST SURGERIES     Family History  Problem Relation Age of Onset  . Early death Mother        mva  . Heart disease Father   . Hyperlipidemia Father   . Arthritis Paternal Grandmother   . Hyperlipidemia Paternal Grandmother   . Hyperlipidemia Paternal Grandfather     Social History   Social History  . Marital status: Married    Spouse name: N/A  . Number of children: N/A  . Years of education: N/A   Occupational History  . Not on file.   Social History Main Topics  . Smoking status: Current Every Day Smoker    Packs/day: 0.50  . Smokeless tobacco: Never Used  . Alcohol use Yes  . Drug use: No  . Sexual activity: Yes    Birth control/ protection: None   Other Topics Concern  . Not on file   Social History Narrative  . No narrative on file   Health Maintenance  Topic Date Due  . INFLUENZA VACCINE  07/10/2017  . PAP SMEAR  05/04/2019  . TETANUS/TDAP  10/01/2026  . HIV Screening  Completed       Review of Systems Pertinent items are noted in HPI.   Objective:      GENERAL: Well-developed, well-nourished female in no acute distress.  HEENT: Normocephalic, atraumatic. Sclerae anicteric.  NECK: Supple. Normal thyroid.  LUNGS: Clear to auscultation bilaterally.  HEART: Regular rate and rhythm. BREASTS: Symmetric in size. No palpable masses or lymphadenopathy, skin changes, or nipple drainage. ABDOMEN: Soft, nontender, nondistended. No organomegaly. PELVIC: Normal external female genitalia. Vagina is  pink and rugated.  Normal discharge. Normal appearing cervix. Uterus is normal in size. No adnexal mass or tenderness. EXTREMITIES: No cyanosis, clubbing, or edema, 2+ distal pulses.    Assessment:    Healthy female exam.      Plan:    pap smear collected Patient declined STD screen Patient will be notified of abnormal results See After Visit Summary for Counseling Recommendations

## 2017-05-21 LAB — CYTOLOGY - PAP
DIAGNOSIS: NEGATIVE
HPV (WINDOPATH): NOT DETECTED

## 2018-06-09 DIAGNOSIS — I10 Essential (primary) hypertension: Secondary | ICD-10-CM | POA: Insufficient documentation

## 2018-06-09 DIAGNOSIS — Z79899 Other long term (current) drug therapy: Secondary | ICD-10-CM | POA: Diagnosis not present

## 2018-06-09 DIAGNOSIS — F17210 Nicotine dependence, cigarettes, uncomplicated: Secondary | ICD-10-CM | POA: Diagnosis not present

## 2018-06-09 DIAGNOSIS — R0781 Pleurodynia: Secondary | ICD-10-CM | POA: Diagnosis present

## 2018-06-09 DIAGNOSIS — J90 Pleural effusion, not elsewhere classified: Secondary | ICD-10-CM | POA: Insufficient documentation

## 2018-06-10 ENCOUNTER — Emergency Department (HOSPITAL_COMMUNITY)
Admission: EM | Admit: 2018-06-10 | Discharge: 2018-06-10 | Disposition: A | Discharge status: Home / Self Care | Payer: Medicaid Other | Attending: Emergency Medicine | Admitting: Emergency Medicine

## 2018-06-10 ENCOUNTER — Emergency Department (HOSPITAL_COMMUNITY): Payer: Medicaid Other

## 2018-06-10 ENCOUNTER — Encounter (HOSPITAL_COMMUNITY): Payer: Self-pay | Admitting: Emergency Medicine

## 2018-06-10 ENCOUNTER — Other Ambulatory Visit: Payer: Self-pay

## 2018-06-10 DIAGNOSIS — R0781 Pleurodynia: Secondary | ICD-10-CM

## 2018-06-10 DIAGNOSIS — J90 Pleural effusion, not elsewhere classified: Secondary | ICD-10-CM

## 2018-06-10 LAB — CBC
HCT: 42.9 % (ref 36.0–46.0)
Hemoglobin: 14.3 g/dL (ref 12.0–15.0)
MCH: 30.2 pg (ref 26.0–34.0)
MCHC: 33.3 g/dL (ref 30.0–36.0)
MCV: 90.5 fL (ref 78.0–100.0)
PLATELETS: 256 10*3/uL (ref 150–400)
RBC: 4.74 MIL/uL (ref 3.87–5.11)
RDW: 13.9 % (ref 11.5–15.5)
WBC: 13.8 10*3/uL — ABNORMAL HIGH (ref 4.0–10.5)

## 2018-06-10 LAB — I-STAT TROPONIN, ED: TROPONIN I, POC: 0 ng/mL (ref 0.00–0.08)

## 2018-06-10 LAB — BASIC METABOLIC PANEL
Anion gap: 10 (ref 5–15)
BUN: 7 mg/dL (ref 6–20)
CALCIUM: 8.9 mg/dL (ref 8.9–10.3)
CO2: 22 mmol/L (ref 22–32)
Chloride: 103 mmol/L (ref 98–111)
Creatinine, Ser: 0.84 mg/dL (ref 0.44–1.00)
GFR calc Af Amer: >60 mL/min (ref >60)
GFR calc non Af Amer: >60 mL/min (ref >60)
GLUCOSE: 124 mg/dL — AB (ref 70–99)
Potassium: 3.7 mmol/L (ref 3.5–5.1)
Sodium: 135 mmol/L (ref 135–145)

## 2018-06-10 LAB — HEPATIC FUNCTION PANEL
ALBUMIN: 3.7 g/dL (ref 3.5–5.0)
ALT: 17 U/L (ref 0–44)
AST: 12 U/L — AB (ref 15–41)
Alkaline Phosphatase: 79 U/L (ref 38–126)
BILIRUBIN TOTAL: 1.3 mg/dL — AB (ref 0.3–1.2)
Bilirubin, Direct: 0.2 mg/dL (ref 0.0–0.2)
Indirect Bilirubin: 1.1 mg/dL — ABNORMAL HIGH (ref 0.3–0.9)
Total Protein: 7.6 g/dL (ref 6.5–8.1)

## 2018-06-10 LAB — I-STAT BETA HCG BLOOD, ED (MC, WL, AP ONLY): I-stat hCG, quantitative: <5 m[IU]/mL (ref <5)

## 2018-06-10 LAB — LIPASE, BLOOD: Lipase: 22 U/L (ref 11–51)

## 2018-06-10 LAB — D-DIMER, QUANTITATIVE (NOT AT ARMC): D DIMER QUANT: 0.75 ug{FEU}/mL — AB (ref 0.00–0.50)

## 2018-06-10 MED ORDER — IOPAMIDOL (ISOVUE-370) INJECTION 76%
INTRAVENOUS | Status: AC
  Filled 2018-06-10: qty 100

## 2018-06-10 MED ORDER — LORAZEPAM 2 MG/ML IJ SOLN
1.0000 mg | Freq: Once | INTRAMUSCULAR | Status: AC
  Administered 2018-06-10: 1 mg via INTRAVENOUS
  Filled 2018-06-10: qty 1

## 2018-06-10 MED ORDER — ONDANSETRON HCL 4 MG/2ML IJ SOLN
4.0000 mg | Freq: Once | INTRAMUSCULAR | Status: AC
  Administered 2018-06-10: 4 mg via INTRAVENOUS
  Filled 2018-06-10: qty 2

## 2018-06-10 MED ORDER — ALBUTEROL SULFATE HFA 108 (90 BASE) MCG/ACT IN AERS
2.0000 | INHALATION_SPRAY | RESPIRATORY_TRACT | Status: DC | PRN
  Administered 2018-06-10: 2 via RESPIRATORY_TRACT
  Filled 2018-06-10: qty 6.7

## 2018-06-10 MED ORDER — IOPAMIDOL (ISOVUE-370) INJECTION 76%
100.0000 mL | Freq: Once | INTRAVENOUS | Status: AC | PRN
  Administered 2018-06-10: 100 mL via INTRAVENOUS

## 2018-06-10 MED ORDER — HYDROMORPHONE HCL 1 MG/ML IJ SOLN
1.0000 mg | Freq: Once | INTRAMUSCULAR | Status: AC
  Administered 2018-06-10: 1 mg via INTRAVENOUS
  Filled 2018-06-10: qty 1

## 2018-06-10 NOTE — ED Triage Notes (Signed)
Patient complaining of left and right chest pain. Patient states she can not breath. Patient went to pool on Sunday. Sunday night is when the pain started. Patient states the pain moved from under the right breast to the left breast.

## 2018-06-10 NOTE — Discharge Instructions (Addendum)
You have been evaluated for your chest pain.  Fortunately there are no evidence of blood clot in your lung or evidence of pneumonia.  There are mild fluid on the right side of your lung.  Please have a repeat chest xray in 1 week to ensure resolution.  Incidentally we see a spot on your liver.  You may discuss with your primary care provider and request either a dedicated liver ultrasound or an abdominal MRI for further evaluation.  Use albuterol inhaler 2 puffs every 4 hrs as needed for shortness of breath. Return if your condition worsen or if you have other concerns.

## 2018-06-10 NOTE — ED Notes (Signed)
Patient refusing to lay down for CT. Unable to obtain after two trips to CT machine.

## 2018-06-10 NOTE — ED Provider Notes (Signed)
[facility]-EMERGENCY DEPT Provider Note   CSN: [id number] Arrival date & time: 06/09/18  2320     History   Chief Complaint Chief Complaint  Patient presents with  . Chest Pain    HPI Shelley Rojas is a 37 y.o. female.  HPI   37 year old female with history of hypertension presenting for evaluation of chest pain.  Patient reports she developed acute onset of pleuritic chest pain started a day ago after she was at the pool for approximately 4 hours.  Described pain as a sharp pleuritic sensation to her right lower chest which has been persistent and now she is having pain throughout her body.  Patient is tearful, states that she can did not take a deep breath, and pain is moderate to severe.  Patient mention she cannot lay down as it worsen her symptoms.  She has never expressed this symptom before.  No specific treatment tried at home.  Report feeling lightheadedness without dizzy.  No fever, chills, productive cough, hemoptysis, nausea, diaphoresis or rash.  Denies increasing stress.  Denies any prior history of PE DVT, no recent surgery, prolonged bed rest, active cancer, hemoptysis, or taking oral birth control pill..  Past Medical History:  Diagnosis Date  . Hypertension     Patient Active Problem List   Diagnosis Date Noted  . Hypertension 12/12/2016    Past Surgical History:  Procedure Laterality Date  . NO PAST SURGERIES       OB History    Gravida  3   Para  3   Term  3   Preterm  0   AB  0   Living  3     SAB  0   TAB  0   Ectopic  0   Multiple  0   Live Births  3            Home Medications    Prior to Admission medications   Medication Sig Start Date End Date Taking? Authorizing Provider  amLODipine (NORVASC) 5 MG tablet Take 1 tablet (5 mg total) by mouth daily. 12/12/16   Orvilla Cornwall A, CNM  ibuprofen (ADVIL,MOTRIN) 600 MG tablet Take 1 tablet (600 mg total) by mouth every 6 (six) hours. Patient not  taking: Reported on 05/20/2017 11/14/16   Orvilla Cornwall A, CNM  loratadine (CLARITIN) 10 MG tablet Take 1 tablet (10 mg total) by mouth daily. 05/10/16   Roe Coombs, CNM    Family History Family History  Problem Relation Age of Onset  . Early death Mother        mva  . Heart disease Father   . Hyperlipidemia Father   . Arthritis Paternal Grandmother   . Hyperlipidemia Paternal Grandmother   . Hyperlipidemia Paternal Grandfather     Social History Social History   Tobacco Use  . Smoking status: Current Every Day Smoker    Packs/day: 0.50    Types: Cigarettes  . Smokeless tobacco: Never Used  Substance Use Topics  . Alcohol use: Yes    Comment: sometimes  . Drug use: No     Allergies   Patient has no known allergies.   Review of Systems Review of Systems  All other systems reviewed and are negative.    Physical Exam Updated Vital Signs BP (!) 129/95 (BP Location: Left Arm)   Pulse 84   Temp 99 F (37.2 C) (Oral)   Resp 18   Ht 5\' 8"  (1.727 m)  Wt 86.2 kg (190 lb)   LMP 06/10/2018   SpO2 100%   BMI 28.89 kg/m   Physical Exam  Constitutional: She appears well-developed and well-nourished. She appears distressed (Patient appears anxious, tearful and refused to lay down.).  HENT:  Head: Atraumatic.  Eyes: Conjunctivae are normal.  Neck: Normal range of motion. Neck supple.  Cardiovascular: Normal pulses.  Tachycardia without murmur rubs or gallops  Pulmonary/Chest: No accessory muscle usage or stridor. Tachypnea noted. No respiratory distress. She has no wheezes. She has no rhonchi. She has no rales.  Abdominal: Soft. There is tenderness (Diffuse abdominal tenderness on gentle palpation without focal point tenderness).  Musculoskeletal:       Right lower leg: She exhibits no edema.       Left lower leg: She exhibits no edema.  Neurological: She is alert.  Skin: No rash noted.  Psychiatric: She has a normal mood and affect.  Nursing note and  vitals reviewed.    ED Treatments / Results  Labs (all labs ordered are listed, but only abnormal results are displayed) Labs Reviewed  BASIC METABOLIC PANEL - Abnormal; Notable for the following components:      Result Value   Glucose, Bld 124 (*)    All other components within normal limits  CBC - Abnormal; Notable for the following components:   WBC 13.8 (*)    All other components within normal limits  D-DIMER, QUANTITATIVE (NOT AT [facility]) - Abnormal; Notable for the following components:   D-Dimer, Quant 0.75 (*)    All other components within normal limits  HEPATIC FUNCTION PANEL - Abnormal; Notable for the following components:   AST 12 (*)    Total Bilirubin 1.3 (*)    Indirect Bilirubin 1.1 (*)    All other components within normal limits  LIPASE, BLOOD  I-STAT TROPONIN, ED  I-STAT BETA HCG BLOOD, ED ([facility], [facility], [facility] ONLY)    EKG EKG Interpretation  Date/Time:  Monday June 09 2018 23:39:07 EDT Ventricular Rate:  88 PR Interval:    QRS Duration: 88 QT Interval:  360 QTC Calculation: 436 R Axis:   58 Text Interpretation:  Sinus rhythm RSR' in V1 or V2, right VCD or RVH Borderline T abnormalities, anterior leads Abnormal ekg Confirmed by Gerhard Munch [phone]) on 06/09/2018 11:45:15 PM   Radiology Dg Chest 2 View  Result Date: 06/10/2018 CLINICAL DATA:  Chest pain EXAM: CHEST - 2 VIEW COMPARISON:  None. FINDINGS: There is bibasilar atelectasis. No pleural effusion or pneumothorax. No pulmonary edema. Cardiomediastinal contours are normal. Shallow lung inflation. In IMPRESSION: Shallow lung inflation with bibasilar atelectasis. Electronically Signed   By: Deatra Robinson M.D.   On: 06/10/2018 01:43   Ct Angio Chest Pe W And/or Wo Contrast  Result Date: 06/10/2018 CLINICAL DATA:  Chest pain EXAM: CT ANGIOGRAPHY CHEST WITH CONTRAST TECHNIQUE: Multidetector CT imaging of the chest was performed using the standard protocol during bolus administration of intravenous contrast.  Multiplanar CT image reconstructions and MIPs were obtained to evaluate the vascular anatomy. CONTRAST:  ISOVUE-370 IOPAMIDOL (ISOVUE-370) INJECTION 76% COMPARISON:  None. FINDINGS: Cardiovascular: --Pulmonary arteries: Contrast injection is sufficient to demonstrate satisfactory opacification of the pulmonary arteries to the segmental level. There is no pulmonary embolus. The main pulmonary artery is within normal limits for size. --Aorta: Satisfactory opacification of the thoracic aorta. No aortic dissection or other acute aortic syndrome. Conventional 3 vessel aortic branching pattern. The aortic course and caliber are normal. There is no aortic atherosclerosis. --Heart:  Normal size. No pericardial effusion. Mediastinum/Nodes: No mediastinal, hilar or axillary lymphadenopathy. The visualized thyroid and thoracic esophageal course are unremarkable. Lungs/Pleura: Bibasilar atelectasis.  Small right pleural effusion. Upper Abdomen: Contrast bolus timing is not optimized for evaluation of the abdominal organs. Hypoattenuating focus in the posterior right hepatic lobe measures 1.5 cm. Musculoskeletal: No chest wall abnormality. No acute or significant osseous findings. Review of the MIP images confirms the above findings. IMPRESSION: 1. No pulmonary embolus or acute aortic syndrome. 2. Small right pleural effusion and bibasilar atelectasis. 3. Hypoattenuating focus in the posterior right hepatic lobe is most likely a hemangioma. This could be confirmed with dedicated hepatic ultrasound or abdominal MRI with and without contrast on a nonemergent outpatient basis. Electronically Signed   By: Deatra RobinsonKevin  Herman M.D.   On: 06/10/2018 05:32    Procedures Ultrasound ED Abd Date/Time: 06/10/2018 5:58 AM Performed by: Fayrene Helperran, Matthews Franks, PA-C Authorized by: Fayrene Helperran, Izear Pine, PA-C   Procedure details:    Indications: abdominal pain     Assessment for:  Gallstones   Aorta:  Not visualized   Left renal:  Not visualized   Right  renal:  Visualized   Hepatobiliary:  Visualized   Bladder:  Not visualized    Images: archived   Study Limitations: bowel gas Right renal findings:    Renal stones: not identified     Intra-abdominal fluid: not identified   Hepatobiliary findings:    Common bile duct:  Normal   Gallbladder wall:  Normal   Gallbladder stones: not identified     Mass: not identified     Intra-abdominal fluid: not identified     Polyps: not identified     Sonographic Murphy's sign: negative   Comments:     Informal bedside US to assess for potential gallbladder etiology.  No obvious evidence of gallstones or cholecystitis on bedside US.    (including critical care time)  Medications Ordered in ED Medications  iopamidol (ISOVUE-370) 76 % injection (has no administration in time range)  albuterol (PROVENTIL HFA;VENTOLIN HFA) 108 (90 Base) MCG/ACT inhaler 2 puff (2 puffs Inhalation Given 06/10/18 0610)  LORazepam (ATIVAN) injection 1 mg (1 mg Intravenous Given 06/10/18 0116)  iopamidol (ISOVUE-370) 76 % injection 100 mL (100 mLs Intravenous Contrast Given 06/10/18 0512)  HYDROmorphone (DILAUDID) injection 1 mg (1 mg Intravenous Given 06/10/18 0441)  ondansetron (ZOFRAN) injection 4 mg (4 mg Intravenous Given 06/10/18 0442)     Initial Impression / Assessment and Plan / ED Course  I have reviewed the triage vital signs and the nursing notes.  Pertinent labs & imaging results that were available during my care of the patient were reviewed by me and considered in my medical decision making (see chart for details).     BP (!) 127/93   Pulse 92   Temp 99 F (37.2 C) (Oral)   Resp 17   Ht 5\' 8"  (1.727 m)   Wt 86.2 kg (190 lb)   LMP 06/10/2018 Comment: negative beta HCG 06/10/18  SpO2 93%   BMI 28.89 kg/m    Final Clinical Impressions(s) / ED Diagnoses   Final diagnoses:  Pleuritic chest pain  Pleural effusion on right    ED Discharge Orders    None     1:02 AM Patient here with pleuritic chest  pain ongoing for more than 24 hours.  She appears to be anxious, tearful and heart monitor showing tachycardia.  I am unable to use PERC criteria to rule out PE therefore a d-dimer  will be obtained.  Ativan given for her symptoms.  Work-up initiated.  Her chest pain is atypical for ACS.  1:48 AM Elevated white count of 13.8, elevated d-dimer of 0.75.  Will obtain chest CT angiogram to rule out PE.  Initial chest x-ray shows shallow lung inflation with bibasilar atelectasis.  Given the location of her discomfort, Dr. Eudelia Bunch recommend performing quick bedside US to assess gallbladder.  I performed US under direct supervision of DR. Cardama, no obvious evidence of gallstones or signs of cholecystitis on initial exam.  Given minimal suspicion for gallbladder etiology, will obtain hepatic function panel and lipase.  Will not obtain formal abd limited US at this time.  PT currently resting comfortably.  CT scan of chest without evidence of PE. Small R pleural effusion, no evidence of pneumonia.  Hypoattenuating focus in the posterior right hepatic lobe is most likely hemangioma.  It is recommended for a dedicated hepatic US or abdominal MRI with and without contrast on a nonemergent outpt basis.  Pt was made aware of this.     Fayrene Helper, PA-C 06/10/18 4540    Nira Conn, MD 06/10/18 [phone]

## 2018-11-25 ENCOUNTER — Ambulatory Visit: Payer: Medicaid Other | Admitting: Obstetrics & Gynecology

## 2018-11-25 ENCOUNTER — Encounter: Payer: Self-pay | Admitting: Obstetrics & Gynecology

## 2018-11-25 DIAGNOSIS — Z3046 Encounter for surveillance of implantable subdermal contraceptive: Secondary | ICD-10-CM | POA: Diagnosis not present

## 2018-11-25 DIAGNOSIS — Z975 Presence of (intrauterine) contraceptive device: Secondary | ICD-10-CM | POA: Insufficient documentation

## 2018-11-25 DIAGNOSIS — N921 Excessive and frequent menstruation with irregular cycle: Secondary | ICD-10-CM

## 2018-11-25 NOTE — Progress Notes (Signed)
GYNECOLOGY OFFICE PROCEDURE NOTE  Shelley Rojas is a 37 y.o. [id number] here for Nexplanon removal.  Last pap smear was on 05/20/17 and was normal.  No other gynecologic concerns. She notes AUB as the reason she requests removal Nexplanon Removal Patient identified, informed consent performed, consent signed.   Appropriate time out taken. Nexplanon site identified.  Area prepped in usual sterile fashon. One ml of 1% lidocaine was used to anesthetize the area at the distal end of the implant. A small stab incision was made right beside the implant on the distal portion.  The Nexplanon rod was grasped using hemostats and removed without difficulty.  There was minimal blood loss. There were no complications.  3 ml of 1% lidocaine was injected around the incision for post-procedure analgesia.  Steri-strips were applied over the small incision.  A pressure bandage was applied to reduce any bruising.  The patient tolerated the procedure well and was given post procedure instructions.  Patient is planning to use condoms or nothing for contraception, may attempt conception.      Adam PhenixArnold, Malike Foglio G, MD Attending Obstetrician & Gynecologist, [facility]  11/25/2018   Patient ID: Shelley CordsHeather Marie Stanis, female   DOB: 1981-01-05, 37 y.o.   MRN: [id number]

## 2018-11-25 NOTE — Progress Notes (Signed)
Pt is in the office for nexplanon removal, placed 04-22-17. Pt states that she has had irregular bleeding since it was placed and wants it removed. Pt is unsure about other forms of BC.

## 2018-12-10 NOTE — L&D Delivery Note (Signed)
OB/GYN Faculty Practice Delivery Note  Shelley Rojas is a 38 y.o. now [phone] s/p SVD at [redacted]w[redacted]d who was admitted for IOL for cHTN.   ROM: 1h 38m with clear fluid GBS Status: Negative Maximum Maternal Temperature: 98.2  Delivery Note At 5:35 PM a viable female was delivered via Vaginal, Spontaneous (Presentation: LOA).  APGAR: 9/9; weight: 7 lbs 13.4 oz (3555 g).   Placenta status: spontaneous via [name], intact.  Cord: 3VC with no complications.  Cord pH: n/a  Anesthesia: Epidural   Episiotomy: None Lacerations: None Suture Repair: N/A Est. Blood Loss (mL): 402    Post-Placental IUD Insertion Procedure Note  Patient identified, informed consent signed prior to delivery, signed copy in chart, time out was performed.    Vaginal, labial and perineal areas thoroughly inspected for lacerations. No lacerations identified prior to insertion of IUD. Liletta - IUD grasped between sterile gloved fingers. Sterile lubrication applied to sterile gloved hand for ease of insertion. Fundus identified through abdominal wall using non-insertion hand. IUD inserted to fundus with bimanual technique. IUD carefully released at the fundus and insertion hand gently removed from vagina.   Strings trimmed to the level of the introitus. Patient tolerated procedure well.  Lot # [id number] Expiration Date: 03/2023  Patient given post procedure instructions and IUD care card with expiration date.  Patient is asked to keep IUD strings tucked in her vagina until her postpartum follow up visit in 4-6 weeks. Patient advised to abstain from sexual intercourse and pulling on strings before her follow-up visit. Patient verbalized an understanding of the plan of care and agrees.   Postpartum Planning [x]  Mom to postpartum.  Baby to Couplet care / Skin to Skin. [x]  plans to bottle feed [x]  message to sent to schedule follow-up  [x]  vaccines UTD  Laury Deep, MSN, CNM 09/18/19, 5:59 PM

## 2019-03-18 DIAGNOSIS — Z3480 Encounter for supervision of other normal pregnancy, unspecified trimester: Secondary | ICD-10-CM | POA: Insufficient documentation

## 2019-03-19 ENCOUNTER — Encounter: Payer: Self-pay | Admitting: Obstetrics & Gynecology

## 2019-03-19 ENCOUNTER — Other Ambulatory Visit (HOSPITAL_COMMUNITY)
Admission: RE | Admit: 2019-03-19 | Discharge: 2019-03-19 | Disposition: A | Discharge status: Home / Self Care | Payer: Medicaid Other | Source: Ambulatory Visit | Attending: Obstetrics & Gynecology | Admitting: Obstetrics & Gynecology

## 2019-03-19 ENCOUNTER — Ambulatory Visit (INDEPENDENT_AMBULATORY_CARE_PROVIDER_SITE_OTHER): Payer: Medicaid Other | Admitting: Obstetrics & Gynecology

## 2019-03-19 ENCOUNTER — Other Ambulatory Visit: Payer: Self-pay

## 2019-03-19 DIAGNOSIS — Z3A11 11 weeks gestation of pregnancy: Secondary | ICD-10-CM

## 2019-03-19 DIAGNOSIS — Z3481 Encounter for supervision of other normal pregnancy, first trimester: Secondary | ICD-10-CM | POA: Diagnosis not present

## 2019-03-19 DIAGNOSIS — Z3480 Encounter for supervision of other normal pregnancy, unspecified trimester: Secondary | ICD-10-CM | POA: Diagnosis not present

## 2019-03-19 DIAGNOSIS — O10919 Unspecified pre-existing hypertension complicating pregnancy, unspecified trimester: Secondary | ICD-10-CM

## 2019-03-19 LAB — OB RESULTS CONSOLE GC/CHLAMYDIA: Gonorrhea: NEGATIVE

## 2019-03-19 MED ORDER — ONDANSETRON 4 MG PO TBDP: 4.0000 mg | ORAL_TABLET | Freq: Four times a day (QID) | ORAL | 0 refills | Status: DC | PRN

## 2019-03-19 MED ORDER — ASPIRIN EC 81 MG PO TBEC
81.0000 mg | DELAYED_RELEASE_TABLET | Freq: Every day | ORAL | 2 refills | Status: DC
Start: 2019-03-19 — End: 2023-06-26

## 2019-03-19 MED ORDER — COMPLETENATE 29-1 MG PO CHEW: 1.0000 | CHEWABLE_TABLET | Freq: Every day | ORAL | Status: DC

## 2019-03-19 NOTE — Progress Notes (Signed)
Pt states she is having nausea.

## 2019-03-19 NOTE — Progress Notes (Signed)
  Subjective:Nausea    Shelley Rojas is a [id number] [redacted]w[redacted]d being seen today for her first obstetrical visit.  Her obstetrical history is significant for advanced maternal age and CHTN. Patient does not intend to breast feed. Pregnancy history fully reviewed.  Patient reports nausea and vomiting.  Vitals:   03/19/19 1319  BP: (!) 145/95  Pulse: 70  Weight: 171 lb (77.6 kg)    HISTORY: OB History  Gravida Para Term Preterm AB Living  4 3 3  0 0 3  SAB TAB Ectopic Multiple Live Births  0 0 0 0 3    # Outcome Date GA Lbr Len/2nd Weight Sex Delivery Anes PTL Lv  4 Current           3 Term 11/12/16 [redacted]w[redacted]d 02:54 / 00:29 6 lb 11.9 oz (3.06 kg) F Vag-Spont EPI  LIV  2 Term      Vag-Spont     1 Term      Vag-Spont      Past Medical History:  Diagnosis Date  . Hypertension    Past Surgical History:  Procedure Laterality Date  . NO PAST SURGERIES     Family History  Problem Relation Age of Onset  . Early death Mother        mva  . Heart disease Father   . Hyperlipidemia Father   . Arthritis Paternal Grandmother   . Hyperlipidemia Paternal Grandmother   . Hyperlipidemia Paternal Grandfather      Exam    Uterus:     Pelvic Exam:    Perineum: No Hemorrhoids   Vulva: normal   Vagina:  normal mucosa   pH:     Cervix: no lesions   Adnexa: normal adnexa   Bony Pelvis: average  System: Breast:  normal appearance, no masses or tenderness   Skin: normal coloration and turgor, no rashes    Neurologic: oriented, normal mood   Extremities: normal strength, tone, and muscle mass   HEENT PERRLA and neck supple with midline trachea   Mouth/Teeth dental hygiene good   Neck supple and no masses   Cardiovascular: regular rate and rhythm, no murmurs or gallops   Respiratory:  appears well, vitals normal, no respiratory distress, acyanotic, normal RR, neck free of mass or lymphadenopathy, chest clear, no wheezing, crepitations, rhonchi, normal symmetric air entry   Abdomen: soft,  non-tender; bowel sounds normal; no masses,  no organomegaly   Urinary: urethral meatus normal      Assessment:    Pregnancy: [id number] Patient Active Problem List   Diagnosis Date Noted  . Chronic hypertension during pregnancy, antepartum 03/19/2019  . Supervision of other normal pregnancy, antepartum 03/18/2019  . Hypertension 12/12/2016        Plan:     Initial labs drawn. Prenatal vitamins. Problem list reviewed and updated. Genetic Screening Panorama  Ultrasound discussed; fetal survey: ordered.  Follow up at 20 weeks. 50% of 30 min visit spent on counseling and coordination of care.  BABYSCRIPTS PATIENT: [x] Initial [x] 12 [ ] 20 [ ] 28 [ ] 32 [ ] 36 [ ] 38 [ ] 39 [ ] 40 If HTN then start Labetalol  ASA 81 mg daily  [name] 03/19/2019

## 2019-03-19 NOTE — Patient Instructions (Signed)

## 2019-03-20 LAB — OBSTETRIC PANEL, INCLUDING HIV
Antibody Screen: NEGATIVE
Basophils Absolute: 0 10*3/uL (ref 0.0–0.2)
Basos: 0 %
EOS (ABSOLUTE): 0.1 10*3/uL (ref 0.0–0.4)
Eos: 1 %
HIV Screen 4th Generation wRfx: NONREACTIVE
Hematocrit: 40.6 % (ref 34.0–46.6)
Hemoglobin: 13.9 g/dL (ref 11.1–15.9)
Hepatitis B Surface Ag: NEGATIVE
Immature Grans (Abs): 0 10*3/uL (ref 0.0–0.1)
Immature Granulocytes: 1 %
Lymphocytes Absolute: 1.1 10*3/uL (ref 0.7–3.1)
Lymphs: 13 %
MCH: 30.8 pg (ref 26.6–33.0)
MCHC: 34.2 g/dL (ref 31.5–35.7)
MCV: 90 fL (ref 79–97)
Monocytes Absolute: 0.4 10*3/uL (ref 0.1–0.9)
Monocytes: 5 %
Neutrophils Absolute: 6.5 10*3/uL (ref 1.4–7.0)
Neutrophils: 80 %
Platelets: 204 10*3/uL (ref 150–450)
RBC: 4.52 x10E6/uL (ref 3.77–5.28)
RDW: 12.8 % (ref 11.7–15.4)
RPR Ser Ql: NONREACTIVE
Rh Factor: POSITIVE
Rubella Antibodies, IGG: 2.64 index (ref >0.99)
WBC: 8.1 10*3/uL (ref 3.4–10.8)

## 2019-03-20 LAB — COMPREHENSIVE METABOLIC PANEL
ALT: 12 IU/L (ref 0–32)
AST: 10 IU/L (ref 0–40)
Albumin/Globulin Ratio: 1.6 (ref 1.2–2.2)
Albumin: 4.1 g/dL (ref 3.8–4.8)
Alkaline Phosphatase: 61 IU/L (ref 39–117)
BUN/Creatinine Ratio: 11 (ref 9–23)
BUN: 7 mg/dL (ref 6–20)
Bilirubin Total: 0.3 mg/dL (ref 0.0–1.2)
CO2: 21 mmol/L (ref 20–29)
Calcium: 9.1 mg/dL (ref 8.7–10.2)
Chloride: 100 mmol/L (ref 96–106)
Creatinine, Ser: 0.64 mg/dL (ref 0.57–1.00)
GFR calc Af Amer: 133 mL/min/{1.73_m2} (ref >59)
GFR calc non Af Amer: 115 mL/min/{1.73_m2} (ref >59)
Globulin, Total: 2.5 g/dL (ref 1.5–4.5)
Glucose: 83 mg/dL (ref 65–99)
Potassium: 3.7 mmol/L (ref 3.5–5.2)
Sodium: 137 mmol/L (ref 134–144)
Total Protein: 6.6 g/dL (ref 6.0–8.5)

## 2019-03-20 LAB — PROTEIN / CREATININE RATIO, URINE
Creatinine, Urine: 106.4 mg/dL
Protein, Ur: 12.1 mg/dL
Protein/Creat Ratio: 114 mg/g creat (ref 0–200)

## 2019-03-21 LAB — CULTURE, OB URINE

## 2019-03-21 LAB — URINE CULTURE, OB REFLEX: Organism ID, Bacteria: NO GROWTH

## 2019-03-23 LAB — CERVICOVAGINAL ANCILLARY ONLY
Chlamydia: NEGATIVE
Neisseria Gonorrhea: NEGATIVE
Trichomonas: NEGATIVE

## 2019-03-25 ENCOUNTER — Encounter: Payer: Self-pay | Admitting: Obstetrics & Gynecology

## 2019-03-26 ENCOUNTER — Encounter: Payer: Self-pay | Admitting: Obstetrics & Gynecology

## 2019-03-26 DIAGNOSIS — Z3480 Encounter for supervision of other normal pregnancy, unspecified trimester: Secondary | ICD-10-CM

## 2019-03-26 LAB — URINE CYTOLOGY ANCILLARY ONLY
Bacterial vaginitis: POSITIVE — AB
Candida vaginitis: NEGATIVE

## 2019-03-30 ENCOUNTER — Telehealth: Payer: Self-pay

## 2019-03-30 NOTE — Telephone Encounter (Signed)
-----   Message from Conan Bowens, MD sent at 03/30/2019 12:47 PM EDT ----- Regarding: RE: BP Contact: [phone] No need for further management/change in meds at this time, patient has chronic HTN. Thanks.  ----- Message ----- From: Maretta Bees, RMA Sent: 03/26/2019   5:15 PM EDT To: Conan Bowens, MD Subject: BP                                             [facility] called about this patient to report that she had 4 elevated readings of 124/90; 122/94; 123/94; 122/94 with NV.   Please advise.

## 2019-03-30 NOTE — Telephone Encounter (Signed)
MESSAGE SENT TO DR.  SEE RESPONSE BELOW

## 2019-04-22 ENCOUNTER — Telehealth: Payer: Self-pay

## 2019-04-22 MED ORDER — PRENATAL 27-0.8 MG PO TABS: 1.0000 | ORAL_TABLET | Freq: Every day | ORAL | 12 refills | Status: DC

## 2019-04-22 NOTE — Telephone Encounter (Signed)
S/w pt and advised that [facility] contacted [place] because BP was 133/103. Pt denied any symptoms, advised to retake, BP was 126/98. Consulted with provider and was advised to tell the pt to continue to monitor, meds will not be started at this point. Advised pt to go to hospital if symptoms occur.

## 2019-05-07 ENCOUNTER — Ambulatory Visit (HOSPITAL_COMMUNITY)
Admission: RE | Admit: 2019-05-07 | Discharge: 2019-05-07 | Disposition: A | Discharge status: Home / Self Care | Payer: Medicaid Other | Source: Ambulatory Visit | Attending: Obstetrics and Gynecology | Admitting: Obstetrics and Gynecology

## 2019-05-07 ENCOUNTER — Encounter (HOSPITAL_COMMUNITY): Payer: Self-pay

## 2019-05-07 ENCOUNTER — Ambulatory Visit (HOSPITAL_COMMUNITY): Payer: Medicaid Other | Admitting: *Deleted

## 2019-05-07 ENCOUNTER — Other Ambulatory Visit: Payer: Self-pay

## 2019-05-07 ENCOUNTER — Other Ambulatory Visit (HOSPITAL_COMMUNITY): Payer: Self-pay | Admitting: *Deleted

## 2019-05-07 VITALS — BP 134/96 | HR 81 | Temp 98.5°F

## 2019-05-07 DIAGNOSIS — O09523 Supervision of elderly multigravida, third trimester: Secondary | ICD-10-CM

## 2019-05-07 DIAGNOSIS — Z3480 Encounter for supervision of other normal pregnancy, unspecified trimester: Secondary | ICD-10-CM | POA: Insufficient documentation

## 2019-05-07 DIAGNOSIS — O10919 Unspecified pre-existing hypertension complicating pregnancy, unspecified trimester: Secondary | ICD-10-CM

## 2019-05-07 DIAGNOSIS — O10012 Pre-existing essential hypertension complicating pregnancy, second trimester: Secondary | ICD-10-CM

## 2019-05-07 DIAGNOSIS — Z363 Encounter for antenatal screening for malformations: Secondary | ICD-10-CM | POA: Diagnosis not present

## 2019-05-07 DIAGNOSIS — O09522 Supervision of elderly multigravida, second trimester: Secondary | ICD-10-CM

## 2019-05-07 DIAGNOSIS — Z3A18 18 weeks gestation of pregnancy: Secondary | ICD-10-CM | POA: Diagnosis not present

## 2019-05-14 ENCOUNTER — Other Ambulatory Visit: Payer: Medicaid Other

## 2019-05-14 ENCOUNTER — Other Ambulatory Visit: Payer: Self-pay

## 2019-05-14 ENCOUNTER — Ambulatory Visit (INDEPENDENT_AMBULATORY_CARE_PROVIDER_SITE_OTHER): Payer: Medicaid Other | Admitting: Obstetrics and Gynecology

## 2019-05-14 ENCOUNTER — Encounter: Payer: Self-pay | Admitting: Obstetrics and Gynecology

## 2019-05-14 VITALS — BP 129/89 | HR 89 | Wt 179.7 lb

## 2019-05-14 DIAGNOSIS — Z3480 Encounter for supervision of other normal pregnancy, unspecified trimester: Secondary | ICD-10-CM

## 2019-05-14 DIAGNOSIS — O10919 Unspecified pre-existing hypertension complicating pregnancy, unspecified trimester: Secondary | ICD-10-CM

## 2019-05-14 DIAGNOSIS — O10913 Unspecified pre-existing hypertension complicating pregnancy, third trimester: Secondary | ICD-10-CM

## 2019-05-14 DIAGNOSIS — Z3009 Encounter for other general counseling and advice on contraception: Secondary | ICD-10-CM

## 2019-05-14 DIAGNOSIS — Z3A36 36 weeks gestation of pregnancy: Secondary | ICD-10-CM

## 2019-05-14 NOTE — Progress Notes (Signed)
   PRENATAL VISIT NOTE  Subjective:  Shelley Rojas is a 38 y.o. [phone] at [redacted]w[redacted]d being seen today for ongoing prenatal care.  She is currently monitored for the following issues for this high-risk pregnancy and has Hypertension; Supervision of other normal pregnancy, antepartum; Chronic hypertension during pregnancy, antepartum; and Unwanted fertility on their problem list.  Patient reports no complaints.  Contractions: Not present. Vag. Bleeding: None.  Movement: Present. Denies leaking of fluid.   The following portions of the patient's history were reviewed and updated as appropriate: allergies, current medications, past family history, past medical history, past social history, past surgical history and problem list.   Objective:   Vitals:   05/14/19 0852  BP: 129/89  Pulse: 89  Weight: 179 lb 11.2 oz (81.5 kg)   Fetal Status: Fetal Heart Rate (bpm): 154   Movement: Present     General:  Alert, oriented and cooperative. Patient is in no acute distress.  Skin: Skin is warm and dry. No rash noted.   Cardiovascular: Normal heart rate noted  Respiratory: Normal respiratory effort, no problems with respiration noted  Abdomen: Soft, gravid, appropriate for gestational age.  Pain/Pressure: Absent     Pelvic: Cervical exam deferred        Extremities: Normal range of motion.  Edema: None  Mental Status: Normal mood and affect. Normal behavior. Normal judgment and thought content.   Assessment and Plan:  Pregnancy: G4P3003 at [redacted]w[redacted]d  1. Supervision of other normal pregnancy, antepartum - AFP TETRA  2. Chronic hypertension during pregnancy, antepartum Cont baby ASA BP normal today Has cuff and has been checking at home  3. Unwanted fertility Needs to sign BTL papers 28 weeks  Preterm labor symptoms and general obstetric precautions including but not limited to vaginal bleeding, contractions, leaking of fluid and fetal movement were reviewed in detail with the patient.  Please refer to After Visit Summary for other counseling recommendations.   Return in about 4 weeks (around 06/11/2019) for OB visit (MD), virtual.  Future Appointments  Date Time Provider Department Center  06/04/2019 10:00 AM [facility]-MFC NURSE WH-MFC MFC-US  06/04/2019 10:00 AM WH-MFC [place] 3 WH-MFCUS MFC-US    Conan Bowens, MD

## 2019-05-14 NOTE — Progress Notes (Signed)
Patient reports fetal movement, denies pain. 

## 2019-05-17 LAB — AFP TETRA
DIA Mom Value: 1.55
DIA Value (EIA): 258.22 pg/mL
DSR (By Age)    1 IN: 189
DSR (Second Trimester) 1 IN: 956
Gestational Age: 19.6 WEEKS
MSAFP Mom: 1.01
MSAFP: 47.4 ng/mL
MSHCG Mom: 0.4
MSHCG: 8984 m[IU]/mL
Maternal Age At EDD: 37 yr
Osb Risk: 10000
T18 (By Age): 1:734 {titer}
Test Results:: NEGATIVE
Weight: 179 [lb_av]
uE3 Mom: 1.97
uE3 Value: 3.69 ng/mL

## 2019-06-04 ENCOUNTER — Other Ambulatory Visit (HOSPITAL_COMMUNITY): Payer: Self-pay | Admitting: *Deleted

## 2019-06-04 ENCOUNTER — Ambulatory Visit (HOSPITAL_COMMUNITY)
Admission: RE | Admit: 2019-06-04 | Discharge: 2019-06-04 | Disposition: A | Discharge status: Home / Self Care | Payer: Medicaid Other | Source: Ambulatory Visit | Attending: Obstetrics and Gynecology | Admitting: Obstetrics and Gynecology

## 2019-06-04 ENCOUNTER — Ambulatory Visit (HOSPITAL_COMMUNITY): Payer: Medicaid Other | Admitting: *Deleted

## 2019-06-04 ENCOUNTER — Encounter (HOSPITAL_COMMUNITY): Payer: Self-pay | Admitting: *Deleted

## 2019-06-04 ENCOUNTER — Other Ambulatory Visit: Payer: Self-pay

## 2019-06-04 VITALS — BP 141/92 | HR 76 | Temp 98.8°F

## 2019-06-04 DIAGNOSIS — O10919 Unspecified pre-existing hypertension complicating pregnancy, unspecified trimester: Secondary | ICD-10-CM

## 2019-06-04 DIAGNOSIS — Z362 Encounter for other antenatal screening follow-up: Secondary | ICD-10-CM | POA: Diagnosis not present

## 2019-06-04 DIAGNOSIS — I10 Essential (primary) hypertension: Secondary | ICD-10-CM | POA: Insufficient documentation

## 2019-06-04 DIAGNOSIS — Z3A22 22 weeks gestation of pregnancy: Secondary | ICD-10-CM | POA: Diagnosis not present

## 2019-06-04 DIAGNOSIS — O09522 Supervision of elderly multigravida, second trimester: Secondary | ICD-10-CM

## 2019-06-04 DIAGNOSIS — O10012 Pre-existing essential hypertension complicating pregnancy, second trimester: Secondary | ICD-10-CM | POA: Diagnosis not present

## 2019-06-11 ENCOUNTER — Ambulatory Visit (INDEPENDENT_AMBULATORY_CARE_PROVIDER_SITE_OTHER): Payer: Medicaid Other | Admitting: Obstetrics and Gynecology

## 2019-06-11 ENCOUNTER — Encounter: Payer: Self-pay | Admitting: Obstetrics and Gynecology

## 2019-06-11 VITALS — BP 134/96 | HR 77

## 2019-06-11 DIAGNOSIS — O10919 Unspecified pre-existing hypertension complicating pregnancy, unspecified trimester: Secondary | ICD-10-CM

## 2019-06-11 DIAGNOSIS — Z3009 Encounter for other general counseling and advice on contraception: Secondary | ICD-10-CM

## 2019-06-11 DIAGNOSIS — O10912 Unspecified pre-existing hypertension complicating pregnancy, second trimester: Secondary | ICD-10-CM

## 2019-06-11 DIAGNOSIS — G44209 Tension-type headache, unspecified, not intractable: Secondary | ICD-10-CM

## 2019-06-11 DIAGNOSIS — Z3A23 23 weeks gestation of pregnancy: Secondary | ICD-10-CM

## 2019-06-11 DIAGNOSIS — Z3480 Encounter for supervision of other normal pregnancy, unspecified trimester: Secondary | ICD-10-CM

## 2019-06-11 MED ORDER — PROMETHAZINE HCL 25 MG PO TABS: 25.0000 mg | ORAL_TABLET | Freq: Four times a day (QID) | ORAL | 2 refills | Status: DC | PRN

## 2019-06-11 NOTE — Progress Notes (Signed)
Pt has no questions/concerns today, doing well.    Pt has had slight HA's, takes Tylenol as needed.

## 2019-06-11 NOTE — Progress Notes (Signed)
TELEHEALTH OBSTETRICS PRENATAL VIRTUAL VIDEO VISIT ENCOUNTER NOTE  Provider [facility]: [facility] [place]   I connected with Shelley Rojas on 06/11/19 at 10:45 AM EDT by WebEx Video Encounter at home and verified that I am speaking with the correct person using two identifiers.   I discussed the limitations, risks, security and privacy concerns of performing an evaluation and management service by telephone and the availability of in person appointments. I also discussed with the patient that there may be a patient responsible charge related to this service. The patient expressed understanding and agreed to proceed. Subjective:  Shelley Rojas is a 38 y.o. [phone] at 2767w6d being seen today for ongoing prenatal care.  She is currently monitored for the following issues for this high-risk pregnancy and has Hypertension; Supervision of other normal pregnancy, antepartum; Chronic hypertension during pregnancy, antepartum; and Unwanted fertility on their problem list.  Patient reports headache and nausea. Not improved by 1 extra strength tylenol. Headaches come and go, seem to be worsening as pregnancy goes on. Contractions: Not present. Vag. Bleeding: None.  Movement: Present. Denies any leaking of fluid.   The following portions of the patient's history were reviewed and updated as appropriate: allergies, current medications, past family history, past medical history, past social history, past surgical history and problem list.   Objective:   Vitals:   06/11/19 1045  BP: (!) 134/96  Pulse: 77    Fetal Status:     Movement: Present     General:  Alert, oriented and cooperative. Patient is in no acute distress.  Respiratory: Normal respiratory effort, no problems with respiration noted  Mental Status: Normal mood and affect. Normal behavior. Normal judgment and thought content.  Rest of physical exam deferred due to type of encounter   Assessment and  Plan:  Pregnancy: G4P3003 at 6867w6d  1. Supervision of other normal pregnancy, antepartum - Patient requesting possible induction earlier than 39 weeks, reviewed we would no proceed with elective induction prior to 39 weeks, but if cervix is favorable, will discuss elective induction at 39 weeks, patient verbalizes understanding - patient thinks her due date may incorrect, states by her LMP, EDD would be 10/07/19, however LMP given at anatomy US give EDD of 10/02/19. She will look for [facility] paperwork from 1st trimester and will discuss next visit  2. Unwanted fertility To sign BTL papers at 28 weeks visit  3. Chronic hypertension during pregnancy, antepartum Cont baby ASA BP stable today  4. Acute non intractable tension-type headache Reviewed appropriate tylenol doses Encouraged increased hydration Okay to have some caffeine  5. Nausea - sent phenergan to pharmacy  Preterm labor symptoms and general obstetric precautions including but not limited to vaginal bleeding, contractions, leaking of fluid and fetal movement were reviewed in detail with the patient. I discussed the assessment and treatment plan with the patient. The patient was provided an opportunity to ask questions and all were answered. The patient agreed with the plan and demonstrated an understanding of the instructions. The patient was advised to call back or seek an in-person office evaluation/[facility] for any urgent or concerning symptoms. Please refer to After Visit Summary for other counseling recommendations.   I provided 15 minutes of face-to-face time during this encounter.  Return in about 2 weeks (around 06/25/2019) for OB visit (MD), virtual.  Future Appointments  Date Time Provider Department Center  07/17/2019 10:30 AM WH-MFC NURSE WH-MFC MFC-US  07/17/2019  10:30 AM WH-MFC [place] 1 WH-MFCUS MFC-US    Suzann Lazaro M [place], [place] for [facility], [place]

## 2019-06-25 ENCOUNTER — Encounter: Payer: Medicaid Other | Admitting: Obstetrics and Gynecology

## 2019-07-01 ENCOUNTER — Ambulatory Visit (INDEPENDENT_AMBULATORY_CARE_PROVIDER_SITE_OTHER): Payer: Medicaid Other

## 2019-07-01 DIAGNOSIS — O10912 Unspecified pre-existing hypertension complicating pregnancy, second trimester: Secondary | ICD-10-CM | POA: Diagnosis not present

## 2019-07-01 DIAGNOSIS — Z3009 Encounter for other general counseling and advice on contraception: Secondary | ICD-10-CM

## 2019-07-01 DIAGNOSIS — O10919 Unspecified pre-existing hypertension complicating pregnancy, unspecified trimester: Secondary | ICD-10-CM

## 2019-07-01 DIAGNOSIS — Z3A26 26 weeks gestation of pregnancy: Secondary | ICD-10-CM | POA: Diagnosis not present

## 2019-07-01 DIAGNOSIS — Z3480 Encounter for supervision of other normal pregnancy, unspecified trimester: Secondary | ICD-10-CM

## 2019-07-01 NOTE — Progress Notes (Signed)
CC: None  

## 2019-07-01 NOTE — Progress Notes (Signed)
   TELEHEALTH VIRTUAL OBSTETRICS VISIT ENCOUNTER NOTE  I connected with Tucker Steedley on 07/01/19 at  1:00 PM EDT by telephone at home and verified that I am speaking with the correct person using two identifiers.   I discussed the limitations, risks, security and privacy concerns of performing an evaluation and management service by telephone and the availability of in person appointments. I also discussed with the patient that there may be a patient responsible charge related to this service. The patient expressed understanding and agreed to proceed.  Subjective:  Shelley Rojas is a 38 y.o. [phone] at [redacted]w[redacted]d being followed for ongoing prenatal care.  She is currently monitored for the following issues for this high-risk pregnancy and has Hypertension; Supervision of other normal pregnancy, antepartum; Chronic hypertension during pregnancy, antepartum; and Unwanted fertility on their problem list.  Patient reports no complaints. Reports fetal movement. Denies any contractions,vaginal discharge,  Bleeding, or leaking of fluid.   Patient reports headaches has improved with taking two tylenol since advised at last visit. She reports the headaches are now shorter in duration.   The following portions of the patient's history were reviewed and updated as appropriate: allergies, current medications, past family history, past medical history, past social history, past surgical history and problem list.   Objective:   General:  Alert, oriented and cooperative.   Mental Status: Normal mood and affect perceived. Normal judgment and thought content.  Rest of physical exam deferred due to type of encounter  BP: 122/97 Assessment and Plan:  Pregnancy: G4P3003 at [redacted]w[redacted]d  1. Supervision of other normal pregnancy, antepartum -Anticipatory guidance for upcoming visits. -Encouraged continuation of tylenol for relief of HA-report any increasing HA frequency or duration. -RTO at 28 weeks for  GTT and relevant labs. -Reviewed need for growth [place] starting at 32 weeks.  Scheduled by MFM.  2. Unwanted fertility -Desires BTL -Will sign papers at 28 week visit.  3. Chronic hypertension during pregnancy, antepartum -BP remain stable -Continue baby ASA -PreEclampsia precautions discussed. -BP parameters given for when to seek out office or MAU support.   Preterm labor symptoms and general obstetric precautions including but not limited to vaginal bleeding, contractions, leaking of fluid and fetal movement were reviewed in detail with the patient.   I discussed the assessment and treatment plan with the patient. The patient was provided an opportunity to ask questions and all were answered. The patient agreed with the plan and demonstrated an understanding of the instructions. The patient was advised to call back or seek an in-person office evaluation/go to [facility]U at [facility] for any urgent or concerning symptoms. Please refer to After Visit Summary for other counseling recommendations.   I provided 7 minutes of non-face-to-face time during this encounter.  No follow-ups on file.  Future Appointments  Date Time Provider [employer]  07/17/2019 10:30 AM [place] MFC-US  07/17/2019 10:30 AM WH-MFC [place] 1 WH-MFCUS MFC-US    Tearra Ouk L Leeta Gr[place], [place] for [employer], [place]

## 2019-07-07 ENCOUNTER — Telehealth: Payer: Self-pay

## 2019-07-07 NOTE — Telephone Encounter (Signed)
Return call to pt regarding message C/o: lower abdominal pain that is constant Pt denies contractions, no LOF,no VB , no Vaginal discharge. +FM no HA's, no swelling no visual changes Pt notes last BM yesterday. Drinking pleanty of fluids. Pt advised comfort measures and labor precautions and to increase water intake, tylenol as directed, change positions, warm bath. If no relief pt advised to go to MAU.  Pt agreeable.

## 2019-07-15 ENCOUNTER — Other Ambulatory Visit: Payer: Medicaid Other

## 2019-07-15 ENCOUNTER — Ambulatory Visit (INDEPENDENT_AMBULATORY_CARE_PROVIDER_SITE_OTHER): Payer: Medicaid Other | Admitting: Obstetrics & Gynecology

## 2019-07-15 ENCOUNTER — Other Ambulatory Visit: Payer: Self-pay

## 2019-07-15 VITALS — BP 138/94 | HR 69 | Wt 181.0 lb

## 2019-07-15 DIAGNOSIS — Z23 Encounter for immunization: Secondary | ICD-10-CM | POA: Diagnosis not present

## 2019-07-15 DIAGNOSIS — Z3A28 28 weeks gestation of pregnancy: Secondary | ICD-10-CM

## 2019-07-15 DIAGNOSIS — Z3483 Encounter for supervision of other normal pregnancy, third trimester: Secondary | ICD-10-CM

## 2019-07-15 DIAGNOSIS — Z3480 Encounter for supervision of other normal pregnancy, unspecified trimester: Secondary | ICD-10-CM

## 2019-07-15 MED ORDER — PANTOPRAZOLE SODIUM 40 MG PO TBEC: 40.0000 mg | DELAYED_RELEASE_TABLET | Freq: Every day | ORAL | 2 refills | Status: DC

## 2019-07-15 NOTE — Progress Notes (Signed)
CC: None  Tdap given

## 2019-07-15 NOTE — Patient Instructions (Signed)

## 2019-07-15 NOTE — Progress Notes (Signed)
   PRENATAL VISIT NOTE  Subjective:  Shelley Rojas is a 38 y.o. [phone] at [redacted]w[redacted]d being seen today for ongoing prenatal care.  She is currently monitored for the following issues for this high-risk pregnancy and has Hypertension; Supervision of other normal pregnancy, antepartum; Chronic hypertension during pregnancy, antepartum; and Unwanted fertility on their problem list.  Patient reports no complaints.  Contractions: Not present. Vag. Bleeding: None.  Movement: Present. Denies leaking of fluid.   The following portions of the patient's history were reviewed and updated as appropriate: allergies, current medications, past family history, past medical history, past social history, past surgical history and problem list.   Objective:   Vitals:   07/15/19 0850  BP: (!) 138/94  Pulse: 69  Weight: 181 lb (82.1 kg)    Fetal Status:     Movement: Present     General:  Alert, oriented and cooperative. Patient is in no acute distress.  Skin: Skin is warm and dry. No rash noted.   Cardiovascular: Normal heart rate noted  Respiratory: Normal respiratory effort, no problems with respiration noted  Abdomen: Soft, gravid, appropriate for gestational age.  Pain/Pressure: Absent     Pelvic: Cervical exam deferred        Extremities: Normal range of motion.  Edema: None  Mental Status: Normal mood and affect. Normal behavior. Normal judgment and thought content.   Assessment and Plan:  Pregnancy: G4P3003 at [redacted]w[redacted]d 1. Supervision of other normal pregnancy, antepartum routine 28 week labs - Glucose Tolerance, 2 Hours w/1 Hour - CBC - HIV Antibody (routine testing w rflx) - RPR Nausea and reflux sx - pantoprazole (PROTONIX) 40 MG tablet; Take 1 tablet (40 mg total) by mouth daily.  Dispense: 30 tablet; Refill: 2  2. Need for Tdap vaccination  - Tdap vaccine greater than or equal to 7yo IM  Preterm labor symptoms and general obstetric precautions including but not limited to vaginal  bleeding, contractions, leaking of fluid and fetal movement were reviewed in detail with the patient. Please refer to After Visit Summary for other counseling recommendations.   Return in about 2 weeks (around 07/29/2019) for virtual.  Future Appointments  Date Time Provider [place]  07/15/2019 10:45 AM [place] Mode, MD [place] None  07/17/2019 10:30 AM [place] NURSE [place] MFC-US  07/17/2019 10:30 AM WH-MFC [place] 1 WH-MFCUS MFC-US    Emeterio Reeve, MD

## 2019-07-16 LAB — CBC
Hematocrit: 35.1 % (ref 34.0–46.6)
Hemoglobin: 11.5 g/dL (ref 11.1–15.9)
MCH: 29.7 pg (ref 26.6–33.0)
MCHC: 32.8 g/dL (ref 31.5–35.7)
MCV: 91 fL (ref 79–97)
Platelets: 202 10*3/uL (ref 150–450)
RBC: 3.87 x10E6/uL (ref 3.77–5.28)
RDW: 13.5 % (ref 11.7–15.4)
WBC: 7.5 10*3/uL (ref 3.4–10.8)

## 2019-07-16 LAB — GLUCOSE TOLERANCE, 2 HOURS W/ 1HR
Glucose, 1 hour: 100 mg/dL (ref 65–179)
Glucose, 2 hour: 128 mg/dL (ref 65–152)
Glucose, Fasting: 87 mg/dL (ref 65–91)

## 2019-07-16 LAB — HIV ANTIBODY (ROUTINE TESTING W REFLEX): HIV Screen 4th Generation wRfx: NONREACTIVE

## 2019-07-16 LAB — RPR: RPR Ser Ql: NONREACTIVE

## 2019-07-17 ENCOUNTER — Other Ambulatory Visit: Payer: Self-pay

## 2019-07-17 ENCOUNTER — Other Ambulatory Visit (HOSPITAL_COMMUNITY): Payer: Self-pay | Admitting: *Deleted

## 2019-07-17 ENCOUNTER — Ambulatory Visit (HOSPITAL_COMMUNITY): Payer: Medicaid Other | Admitting: *Deleted

## 2019-07-17 ENCOUNTER — Encounter (HOSPITAL_COMMUNITY): Payer: Self-pay | Admitting: *Deleted

## 2019-07-17 ENCOUNTER — Ambulatory Visit (HOSPITAL_COMMUNITY)
Admission: RE | Admit: 2019-07-17 | Discharge: 2019-07-17 | Disposition: A | Discharge status: Home / Self Care | Payer: Medicaid Other | Source: Ambulatory Visit | Attending: Obstetrics and Gynecology | Admitting: Obstetrics and Gynecology

## 2019-07-17 VITALS — BP 158/98 | HR 102 | Temp 98.6°F

## 2019-07-17 DIAGNOSIS — Z362 Encounter for other antenatal screening follow-up: Secondary | ICD-10-CM | POA: Diagnosis not present

## 2019-07-17 DIAGNOSIS — Z3A29 29 weeks gestation of pregnancy: Secondary | ICD-10-CM

## 2019-07-17 DIAGNOSIS — O10919 Unspecified pre-existing hypertension complicating pregnancy, unspecified trimester: Secondary | ICD-10-CM

## 2019-07-17 DIAGNOSIS — O10013 Pre-existing essential hypertension complicating pregnancy, third trimester: Secondary | ICD-10-CM

## 2019-07-17 DIAGNOSIS — O09523 Supervision of elderly multigravida, third trimester: Secondary | ICD-10-CM

## 2019-07-17 DIAGNOSIS — I10 Essential (primary) hypertension: Secondary | ICD-10-CM

## 2019-07-29 ENCOUNTER — Ambulatory Visit (INDEPENDENT_AMBULATORY_CARE_PROVIDER_SITE_OTHER): Payer: Medicaid Other | Admitting: Obstetrics and Gynecology

## 2019-07-29 ENCOUNTER — Encounter: Payer: Self-pay | Admitting: Obstetrics and Gynecology

## 2019-07-29 VITALS — BP 128/96 | HR 88

## 2019-07-29 DIAGNOSIS — Z3480 Encounter for supervision of other normal pregnancy, unspecified trimester: Secondary | ICD-10-CM

## 2019-07-29 DIAGNOSIS — O10919 Unspecified pre-existing hypertension complicating pregnancy, unspecified trimester: Secondary | ICD-10-CM

## 2019-07-29 DIAGNOSIS — O10913 Unspecified pre-existing hypertension complicating pregnancy, third trimester: Secondary | ICD-10-CM

## 2019-07-29 DIAGNOSIS — Z3A30 30 weeks gestation of pregnancy: Secondary | ICD-10-CM

## 2019-07-29 DIAGNOSIS — Z3009 Encounter for other general counseling and advice on contraception: Secondary | ICD-10-CM

## 2019-07-29 NOTE — Addendum Note (Signed)
Addended by: Vivien Rota on: 07/29/2019 01:51 PM   Modules accepted: Orders

## 2019-07-29 NOTE — Progress Notes (Signed)
   [place] VIRTUAL VIDEO VISIT ENCOUNTER NOTE  Provider location: Center for [place] at [place]   I connected with Karliah Kowalchuk on 07/29/19 at 11:15 AM EDT by WebEx Video Encounter at home and verified that I am speaking with the correct person using two identifiers.   I discussed the limitations, risks, security and privacy concerns of performing an evaluation and management service virtually and the availability of in person appointments. I also discussed with the patient that there may be a patient responsible charge related to this service. The patient expressed understanding and agreed to proceed. Subjective:  Shelley Rojas is a 38 y.o. [phone] at [redacted]w[redacted]d being seen today for ongoing prenatal care.  She is currently monitored for the following issues for this high-risk pregnancy and has Hypertension; Supervision of other normal pregnancy, antepartum; Chronic hypertension during pregnancy, antepartum; and Unwanted fertility on their problem list.  Patient reports fatigue.  Contractions: Not present. Vag. Bleeding: None.  Movement: Present. Denies any leaking of fluid.   The following portions of the patient's history were reviewed and updated as appropriate: allergies, current medications, past family history, past medical history, past social history, past surgical history and problem list.   Objective:   Vitals:   07/29/19 1054  BP: (!) 128/96  Pulse: 88    Fetal Status:     Movement: Present     General:  Alert, oriented and cooperative. Patient is in no acute distress.  Respiratory: Normal respiratory effort, no problems with respiration noted  Mental Status: Normal mood and affect. Normal behavior. Normal judgment and thought content.  Rest of physical exam deferred due to type of encounter  Imaging:  Assessment and Plan:  Pregnancy: G4P3003 at [redacted]w[redacted]d  1. Supervision of other normal pregnancy, antepartum Doing well  2.  Unwanted fertility For BTL  3. Chronic hypertension during pregnancy, antepartum Cont baby aspirin BP stable Had one elevated BP in MFM in the 150s, patient reports all BP at home have been 295-284X systolic Reviewed weekly BPP starting 32 weeks, to schedule today  Preterm labor symptoms and general obstetric precautions including but not limited to vaginal bleeding, contractions, leaking of fluid and fetal movement were reviewed in detail with the patient. I discussed the assessment and treatment plan with the patient. The patient was provided an opportunity to ask questions and all were answered. The patient agreed with the plan and demonstrated an understanding of the instructions. The patient was advised to call back or seek an in-person office evaluation/go to MAU at [facility] for any urgent or concerning symptoms. Please refer to After Visit Summary for other counseling recommendations.   I provided 15 minutes of face-to-face time during this encounter.  Return in about 2 weeks (around 08/12/2019) for OB visit (MD), virtual.  Future Appointments  Date Time Provider [place]  07/29/2019 11:15 AM Sloan Leiter, MD [place] None  08/13/2019 10:45 AM [place] 2 WH-MFCUS MFC-US    Sloan Leiter, [place] for [place], [place]

## 2019-08-12 ENCOUNTER — Encounter: Payer: Self-pay | Admitting: Obstetrics and Gynecology

## 2019-08-12 ENCOUNTER — Ambulatory Visit (INDEPENDENT_AMBULATORY_CARE_PROVIDER_SITE_OTHER): Payer: Medicaid Other | Admitting: Obstetrics and Gynecology

## 2019-08-12 VITALS — BP 132/94 | HR 81

## 2019-08-12 DIAGNOSIS — Z3009 Encounter for other general counseling and advice on contraception: Secondary | ICD-10-CM

## 2019-08-12 DIAGNOSIS — O10919 Unspecified pre-existing hypertension complicating pregnancy, unspecified trimester: Secondary | ICD-10-CM

## 2019-08-12 DIAGNOSIS — Z3A32 32 weeks gestation of pregnancy: Secondary | ICD-10-CM

## 2019-08-12 DIAGNOSIS — Z3480 Encounter for supervision of other normal pregnancy, unspecified trimester: Secondary | ICD-10-CM

## 2019-08-12 DIAGNOSIS — O10913 Unspecified pre-existing hypertension complicating pregnancy, third trimester: Secondary | ICD-10-CM

## 2019-08-12 NOTE — Progress Notes (Signed)
[facility] VIRTUAL VIDEO VISIT ENCOUNTER NOTE  Provider location: [facility] [place]   I connected with Shelley Rojas on 08/12/19 at  4:00 PM EDT by [place] Encounter at home and verified that I am speaking with the correct person using two identifiers.   I discussed the limitations, risks, security and privacy concerns of performing an evaluation and management service virtually and the availability of in person appointments. I also discussed with the patient that there may be a patient responsible charge related to this service. The patient expressed understanding and agreed to proceed. Subjective:  Shelley Rojas is a 38 y.o. [phone] at [redacted]w[redacted]d being seen today for ongoing prenatal care.  She is currently monitored for the following issues for this high-risk pregnancy and has Hypertension; Supervision of other normal pregnancy, antepartum; Chronic hypertension during pregnancy, antepartum; and Unwanted fertility on their problem list.  Patient reports general discomforts of pregnancy. Denies Ha or visual changes.  Contractions: Not present. Vag. Bleeding: None.  Movement: Present. Denies any leaking of fluid.   The following portions of the patient's history were reviewed and updated as appropriate: allergies, current medications, past family history, past medical history, past social history, past surgical history and problem list.   Objective:   Vitals:   08/12/19 1554  BP: (!) 132/94  Pulse: 81    Fetal Status:     Movement: Present     General:  Alert, oriented and cooperative. Patient is in no acute distress.  Respiratory: Normal respiratory effort, no problems with respiration noted  Mental Status: Normal mood and affect. Normal behavior. Normal judgment and thought content.  Rest of physical exam deferred due to type of encounter  Imaging: [place] Mfm Ob Follow Up  Result Date: 07/17/2019  ----------------------------------------------------------------------  OBSTETRICS REPORT                       (Signed Final 07/17/2019 12:23 pm) ---------------------------------------------------------------------- Patient Info  ID #:       [id number]                          D.O.B.:  [birth year] (37 yrs)  Name:       Shelley Rojas            Visit Date: 07/17/2019 10:38 am ---------------------------------------------------------------------- Performed By  Performed By:     Georgie Chard        Ref. Address:     [address]  [place]                                                             [id number]  Attending:        Noralee Space MD        [facility]  Referred By:      [facility] ---------------------------------------------------------------------- Orders   #  Description                          Code         Ordered By   1  [place] MFM OB FOLLOW UP                  [id number]     [facility]  ----------------------------------------------------------------------   #  Order #                    Accession #                 Episode #   1  [id number]                  [id number]                  [id number]  ---------------------------------------------------------------------- Indications   Advanced maternal age multigravida 23+,        O70.522   second trimester (low risk NIPS)   Hypertension - Chronic/Pre-existing (Low       O10.019   dose aspirin)   Encounter for other antenatal screening        Z36.2   follow-up   [redacted] weeks gestation of pregnancy                Z3A.29  ---------------------------------------------------------------------- Vital Signs  Weight (lb): 181                                Height:        5'8"  BMI:         27.52 ---------------------------------------------------------------------- Fetal Evaluation  Num Of Fetuses:         1  Fetal Heart Rate(bpm):  135  Cardiac Activity:       Observed  Presentation:           Cephalic  Placenta:               Posterior  P. Cord Insertion:      Previously Visualized  Amniotic Fluid  AFI FV:      Within normal limits  AFI Sum(cm)     %Tile       Largest Pocket(cm)  15.59           56          5.38  RUQ(cm)  RLQ(cm)       LUQ(cm)        LLQ(cm)  2.77          2.93          5.38           4.51 ---------------------------------------------------------------------- Biometry  BPD:      73.6  mm     G. Age:  29w 4d         55  %    CI:        74.38   %    70 - 86                                                          FL/HC:      18.9   %    19.6 - 20.8  HC:      270.9  mm     G. Age:  29w 4d         32  %    HC/AC:      1.07        0.99 - 1.21  AC:       253   mm     G. Age:  29w 3d         59  %    FL/BPD:     69.7   %    71 - 87  FL:       51.3  mm     G. Age:  27w 3d          5  %    FL/AC:      20.3   %    20 - 24  HUM:      46.1  mm     G. Age:  27w 1d          6  %  LV:        4.1  mm  Est. FW:    1291  gm    2 lb 14 oz      31  % ---------------------------------------------------------------------- OB History  Gravidity:    4         Term:   3        Prem:   0        SAB:   0  TOP:          0       Ectopic:  0        Living: 3 ---------------------------------------------------------------------- Gestational Age  LMP:           29w 0d        Date:  12/26/18                 EDD:   10/02/19  U/S Today:     29w 0d                                        EDD:   10/02/19  Best:          29w 0d     Det. By:  LMP  (12/26/18)          EDD:  10/02/19 ---------------------------------------------------------------------- Anatomy  Cranium:               Appears normal         Stomach:                Appears normal, left                                                                         sided  Cavum:                 Appears normal         Abdomen:                Appears normal  Ventricles:            Appears normal         Kidneys:                Appear normal  Heart:                 Appears normal         Bladder:                Appears normal                         (4CH, axis, and                         situs)  Diaphragm:             Appears normal ---------------------------------------------------------------------- Impression  Chronic hypertension. Patient does not take  antihypertensives. At prenatal visit on 07/15/19, her blood  pressures were 134/96 and 138/94 mm Hg. She reports mild  headaches off and on, no visual disturbances or right upper  quadrant pain.  Fetal growth is appropriate for gestational age. Amniotic fluid  is normal and good fetal activity is seen.  Our office blood pressures are : 140/99 and 158/98 mm Hg.  I discussed MAU evaluation, but the patient does not want to  go to the hospital. She has BP instrument at home and I  instructed her to monitor twice daily. If SBP is persistently  greater than 155 or DBP greater than 105 mm Hg, she  should call her provider. ---------------------------------------------------------------------- Recommendations  -An appointment was made for her to return in 4 weeks for  fetal growth assessment. ----------------------------------------------------------------------                  Noralee Spaceavi Shankar, MD Electronically Signed Final Report   07/17/2019 12:23 pm ----------------------------------------------------------------------   Assessment and Plan:  Pregnancy: [id number] at 814w5d 1. Supervision of other normal pregnancy, antepartum Stable  2. Chronic hypertension during pregnancy, antepartum BP 140-150's/90's Qd BASA Continue with BP monitoring and weekly antenatal testing  3. Unwanted fertility BTL papers signed   Preterm labor symptoms and general obstetric precautions including but  not limited to vaginal bleeding, contractions, leaking of fluid and fetal movement were reviewed in detail with the patient. I discussed the assessment and treatment plan with the patient. The patient was provided an opportunity to ask questions and all were answered. The patient agreed  with the plan and demonstrated an understanding of the instructions. The patient was advised to call back or seek an in-person office evaluation/[facility] for any urgent or concerning symptoms. Please refer to After Visit Summary for other counseling recommendations.   I provided 8 minutes of face-to-face time during this encounter.  Return in about 2 weeks (around 08/26/2019) for OB visit, face to face.  Future Appointments  Date Time Provider Department Center  08/13/2019 10:45 AM [facility] US 2 [facility]-M[facility]US M[facility]-US    Hermina StaggersMichael L Khoa Opdahl, MD [facility], [place] [facility]

## 2019-08-12 NOTE — Progress Notes (Signed)
Pt has u/s scheduled for tomorrow. Pt has no complaints today.

## 2019-08-13 ENCOUNTER — Other Ambulatory Visit: Payer: Self-pay

## 2019-08-13 ENCOUNTER — Ambulatory Visit (HOSPITAL_COMMUNITY): Payer: Medicaid Other | Admitting: *Deleted

## 2019-08-13 ENCOUNTER — Encounter (HOSPITAL_COMMUNITY): Payer: Self-pay

## 2019-08-13 ENCOUNTER — Other Ambulatory Visit (HOSPITAL_COMMUNITY): Payer: Self-pay | Admitting: *Deleted

## 2019-08-13 ENCOUNTER — Ambulatory Visit (HOSPITAL_COMMUNITY)
Admission: RE | Admit: 2019-08-13 | Discharge: 2019-08-13 | Disposition: A | Discharge status: Home / Self Care | Payer: Medicaid Other | Source: Ambulatory Visit | Attending: Obstetrics and Gynecology | Admitting: Obstetrics and Gynecology

## 2019-08-13 VITALS — BP 135/95 | HR 75 | Temp 98.3°F

## 2019-08-13 DIAGNOSIS — Z362 Encounter for other antenatal screening follow-up: Secondary | ICD-10-CM | POA: Diagnosis not present

## 2019-08-13 DIAGNOSIS — O09523 Supervision of elderly multigravida, third trimester: Secondary | ICD-10-CM

## 2019-08-13 DIAGNOSIS — Z3A32 32 weeks gestation of pregnancy: Secondary | ICD-10-CM | POA: Diagnosis not present

## 2019-08-13 DIAGNOSIS — O10013 Pre-existing essential hypertension complicating pregnancy, third trimester: Secondary | ICD-10-CM

## 2019-08-13 DIAGNOSIS — O10919 Unspecified pre-existing hypertension complicating pregnancy, unspecified trimester: Secondary | ICD-10-CM | POA: Diagnosis present

## 2019-08-13 DIAGNOSIS — O10019 Pre-existing essential hypertension complicating pregnancy, unspecified trimester: Secondary | ICD-10-CM

## 2019-08-13 DIAGNOSIS — O09529 Supervision of elderly multigravida, unspecified trimester: Secondary | ICD-10-CM

## 2019-08-21 ENCOUNTER — Ambulatory Visit (HOSPITAL_COMMUNITY): Payer: Medicaid Other

## 2019-08-21 ENCOUNTER — Other Ambulatory Visit: Payer: Self-pay

## 2019-08-21 ENCOUNTER — Encounter (HOSPITAL_COMMUNITY): Payer: Self-pay

## 2019-08-21 ENCOUNTER — Ambulatory Visit (HOSPITAL_BASED_OUTPATIENT_CLINIC_OR_DEPARTMENT_OTHER)
Admission: RE | Admit: 2019-08-21 | Discharge: 2019-08-21 | Disposition: A | Discharge status: Home / Self Care | Payer: Medicaid Other | Source: Ambulatory Visit | Attending: Obstetrics and Gynecology | Admitting: Obstetrics and Gynecology

## 2019-08-21 ENCOUNTER — Other Ambulatory Visit (HOSPITAL_COMMUNITY): Payer: Self-pay | Admitting: Obstetrics

## 2019-08-21 ENCOUNTER — Ambulatory Visit (HOSPITAL_COMMUNITY): Payer: Medicaid Other | Admitting: *Deleted

## 2019-08-21 ENCOUNTER — Ambulatory Visit (HOSPITAL_COMMUNITY)
Admission: RE | Admit: 2019-08-21 | Discharge: 2019-08-21 | Disposition: A | Discharge status: Home / Self Care | Payer: Medicaid Other | Source: Ambulatory Visit | Attending: Obstetrics and Gynecology | Admitting: Obstetrics and Gynecology

## 2019-08-21 VITALS — BP 135/92 | HR 93 | Temp 98.1°F

## 2019-08-21 VITALS — BP 134/97 | HR 87

## 2019-08-21 DIAGNOSIS — Z3A34 34 weeks gestation of pregnancy: Secondary | ICD-10-CM

## 2019-08-21 DIAGNOSIS — O10919 Unspecified pre-existing hypertension complicating pregnancy, unspecified trimester: Secondary | ICD-10-CM | POA: Diagnosis present

## 2019-08-21 DIAGNOSIS — O09523 Supervision of elderly multigravida, third trimester: Secondary | ICD-10-CM

## 2019-08-21 DIAGNOSIS — O09529 Supervision of elderly multigravida, unspecified trimester: Secondary | ICD-10-CM

## 2019-08-21 DIAGNOSIS — O10013 Pre-existing essential hypertension complicating pregnancy, third trimester: Secondary | ICD-10-CM

## 2019-08-21 NOTE — Procedures (Signed)
Shelley Rojas 1981-04-27 [redacted]w[redacted]d  Fetus A Non-Stress Test Interpretation for 08/21/19  Indication: Unsatisfactory BPP  Fetal Heart Rate A Mode: External Baseline Rate (A): 135 bpm Variability: Moderate Accelerations: 15 x 15 Decelerations: None Multiple birth?: No  Uterine Activity Mode: Palpation, Toco Contraction Frequency (min): None Resting Tone Palpated: Relaxed Resting Time: Adequate  Interpretation (Fetal Testing) Nonstress Test Interpretation: Reactive Comments: Reviewed tracing with Dr. Donalee Citrin

## 2019-08-27 ENCOUNTER — Ambulatory Visit (HOSPITAL_COMMUNITY): Payer: Medicaid Other

## 2019-08-27 ENCOUNTER — Ambulatory Visit (INDEPENDENT_AMBULATORY_CARE_PROVIDER_SITE_OTHER): Payer: Medicaid Other | Admitting: Obstetrics and Gynecology

## 2019-08-27 ENCOUNTER — Other Ambulatory Visit: Payer: Self-pay

## 2019-08-27 ENCOUNTER — Encounter: Payer: Self-pay | Admitting: Obstetrics and Gynecology

## 2019-08-27 VITALS — BP 135/99 | HR 94

## 2019-08-27 DIAGNOSIS — O10913 Unspecified pre-existing hypertension complicating pregnancy, third trimester: Secondary | ICD-10-CM

## 2019-08-27 DIAGNOSIS — O10919 Unspecified pre-existing hypertension complicating pregnancy, unspecified trimester: Secondary | ICD-10-CM

## 2019-08-27 DIAGNOSIS — Z3009 Encounter for other general counseling and advice on contraception: Secondary | ICD-10-CM

## 2019-08-27 DIAGNOSIS — Z3A34 34 weeks gestation of pregnancy: Secondary | ICD-10-CM

## 2019-08-27 DIAGNOSIS — Z3480 Encounter for supervision of other normal pregnancy, unspecified trimester: Secondary | ICD-10-CM

## 2019-08-27 NOTE — Progress Notes (Signed)
TELEHEALTH OBSTETRICS PRENATAL VIRTUAL VIDEO VISIT ENCOUNTER NOTE  Provider location: [facility] [place]   I connected with Shelley Rojas on 08/27/19 at  9:15 AM EDT by WebEx Encounter at home and verified that I am speaking with the correct person using two identifiers.   I discussed the limitations, risks, security and privacy concerns of performing an evaluation and management service virtually and the availability of in person appointments. I also discussed with the patient that there may be a patient responsible charge related to this service. The patient expressed understanding and agreed to proceed. Subjective:  Shelley Rojas is a 38 y.o. [phone] at [redacted]w[redacted]d being seen today for ongoing prenatal care.  She is currently monitored for the following issues for this high-risk pregnancy and has Hypertension; Supervision of other normal pregnancy, antepartum; Chronic hypertension during pregnancy, antepartum; and Unwanted fertility on their problem list.  Patient reports no complaints.  Contractions: Not present. Vag. Bleeding: None.  Movement: Present. Denies any leaking of fluid.   The following portions of the patient's history were reviewed and updated as appropriate: allergies, current medications, past family history, past medical history, past social history, past surgical history and problem list.   Objective:   Vitals:   08/27/19 0847  BP: (!) 135/99  Pulse: 94    Fetal Status:     Movement: Present     General:  Alert, oriented and cooperative. Patient is in no acute distress.  Respiratory: Normal respiratory effort, no problems with respiration noted  Mental Status: Normal mood and affect. Normal behavior. Normal judgment and thought content.  Rest of physical exam deferred due to type of encounter  Imaging: [place] Mfm Fetal Bpp W/nonstress  Result Date: 08/21/2019 ----------------------------------------------------------------------   OBSTETRICS REPORT                    (Corrected Final 08/21/2019 04:31 pm) ---------------------------------------------------------------------- Patient Info  ID #:       [id number]                          D.O.B.:  1981/02/13 (37 yrs)  Name:       Shelley Rojas            Visit Date: 08/21/2019 08:48 am ---------------------------------------------------------------------- Performed By  Performed By:     Sandi Mealy        Ref. Address:     [address]                                                             [phone]  [place]                                                             [address]  Attending:        Noralee Space MD        [facility]  Referred By:      [facility] ---------------------------------------------------------------------- Orders   #  Description                          Code         Ordered By   1  [place] MFM FETAL BPP                     60454.0      Rosana Hoes      W/NONSTRESS  ----------------------------------------------------------------------   #  Order #                    Accession #                 Episode #   1  [id number]                  [id number]                  [id number]  ---------------------------------------------------------------------- Indications   Hypertension - Chronic/Pre-existing (Low       O10.019   dose aspirin)   Advanced maternal age multigravida 12+,        O85.523   third trimester   Encounter for other antenatal screening        Z36.2   follow-up   [redacted] weeks gestation of pregnancy                Z3A.34  ---------------------------------------------------------------------- Vital Signs  Weight (lb): 181                               Height:        5'8"  BMI:         27.52  ---------------------------------------------------------------------- Fetal Evaluation  Num Of Fetuses:         1  Fetal Heart Rate(bpm):  136  Cardiac Activity:       Observed  Presentation:           Cephalic  Placenta:               Posterior Fundal  P. Cord Insertion:      Visualized  Amniotic Fluid  AFI FV:      Within normal limits  AFI Sum(cm)     %Tile       Largest Pocket(cm)  9.93            19  4.91  RUQ(cm)       RLQ(cm)       LUQ(cm)        LLQ(cm)  0             3.16          1.86           4.91 ---------------------------------------------------------------------- Biophysical Evaluation  Amniotic F.V:   Within normal limits       F. Tone:        Observed  F. Movement:    Not Observed               N.S.T:          Reactive  F. Breathing:   Not Observed               Score:          6/10 ---------------------------------------------------------------------- OB History  Gravidity:    4         Term:   3        Prem:   0        SAB:   0  TOP:          0       Ectopic:  0        Living: 3 ---------------------------------------------------------------------- Gestational Age  LMP:           34w 0d        Date:  12/26/18                 EDD:   10/02/19  Best:          34w 0d     Det. By:  LMP  (12/26/18)          EDD:   10/02/19 ---------------------------------------------------------------------- Anatomy  Thoracic:              Appears normal         Abdomen:                Appears normal  Heart:                 Appears normal         Kidneys:                Appear normal                         (4CH, axis, and                         situs)  Diaphragm:             Appears normal         Bladder:                Appears normal  Stomach:               Appears normal, left                         sided ---------------------------------------------------------------------- Impression  Chronic hypertension. Patient is not taking antihypertensives.  BP at our office today 135/92 mm Hg.  On ultrasound,  amniotic fluid is normal, Fetal movements and  fetal breathing movements did not meet the criteria. NST is  reactive. BPP 6/10.  I explained the significance of BPP scores and recommended  repeating BPP later this afternoon. ----------------------------------------------------------------------  Recommendations  -Patient will return for BPP today. ----------------------------------------------------------------------                       Noralee Space, MD Electronically Signed Corrected Final Report  08/21/2019 04:31 pm ----------------------------------------------------------------------  [place] Mfm Fetal Bpp Wo Non Stress  Result Date: 08/21/2019 ----------------------------------------------------------------------  OBSTETRICS REPORT                       (Signed Final 08/21/2019 04:30 pm) ---------------------------------------------------------------------- Patient Info  ID #:       [id number]                          D.O.B.:  1981/09/01 (37 yrs)  Name:       Shelley Rojas            Visit Date: 08/21/2019 03:07 pm ---------------------------------------------------------------------- Performed By  Performed By:     Fayne Norrie BS,      Ref. Address:     [address]                                                             [address]                                                             [address]                                                             [place]                                                             [address]  Attending:        Noralee Space MD        [facility]  Referred By:      [facility] ---------------------------------------------------------------------- Orders   #  Description                          Code         Ordered By   1  US MFM FETAL BPP WO NON              76819.01     RAVI SHANKAR      STRESS   ----------------------------------------------------------------------   #  Order #                    Accession #                 Episode #   1  [id number]                  [id number]                  [id number]  ---------------------------------------------------------------------- Indications   [redacted] weeks gestation of pregnancy                Z3A.34   Hypertension - Chronic/Pre-existing (Low       O10.019   dose aspirin)   Advanced maternal age multigravida 4735+,        13O09.523   third trimester   Encounter for other antenatal screening        Z36.2   follow-up   [redacted] weeks gestation of pregnancy                Z3A.34  ---------------------------------------------------------------------- Vital Signs                                                 Height:        5'8" ---------------------------------------------------------------------- Fetal Evaluation  Num Of Fetuses:         1  Fetal Heart Rate(bpm):  137  Cardiac Activity:       Observed  Placenta:               Posterior Fundal  P. Cord Insertion:      Previously Visualized  Amniotic Fluid  AFI FV:      Within normal limits  AFI Sum(cm)     %Tile       Largest Pocket(cm)  13.42           44          4.08  RUQ(cm)       RLQ(cm)       LUQ(cm)        LLQ(cm)  3.14          3.4           2.8            4.08 ---------------------------------------------------------------------- Biophysical Evaluation  Amniotic F.V:   Pocket => 2 cm             F. Tone:        Observed  F. Movement:    Observed                   Score:          8/8  F. Breathing:   Observed ---------------------------------------------------------------------- OB History  Gravidity:    4         Term:   3        Prem:   0        SAB:   0  TOP:          0       Ectopic:  0  Living: 3 ---------------------------------------------------------------------- Gestational Age  LMP:           34w 0d        Date:  12/26/18                 EDD:   10/02/19  Best:          34w 0d     Det. By:  LMP  (12/26/18)           EDD:   10/02/19 ---------------------------------------------------------------------- Impression  Patient return for BPP.  Antenatal performed this morning  showed BPP score of 6/10.  She reports good fetal  movements now.  Amniotic fluid is normal good fetal activity seen.  Antenatal  testing is reassuring.  BPP 8 out of 8.  We reassured the patient findings. ---------------------------------------------------------------------- Recommendations  Continue weekly antenatal testing till delivery. ----------------------------------------------------------------------                  Noralee Spaceavi Shankar, MD Electronically Signed Final Report   08/21/2019 04:30 pm ----------------------------------------------------------------------  [place] Mfm Fetal Bpp Wo Non Stress  Result Date: 08/14/2019 ----------------------------------------------------------------------  OBSTETRICS REPORT                    (Corrected Final 08/14/2019 03:14 pm) ---------------------------------------------------------------------- Patient Info  ID #:       [id number]                          D.O.B.:  1981-01-01 (37 yrs)  Name:       Shelley Rojas            Visit Date: 08/13/2019 11:02 am ---------------------------------------------------------------------- Performed By  Performed By:     Aundra MilletKasie Kiser            Ref. Address:     [address]                    [address]                                                             [address]                                                             [address]                                                             [place]                                                             [id number]  Attending:        Ma RingsVictor Fang MD  Location[facility]  Referred By:      [facility] ---------------------------------------------------------------------- Orders   #  Description                           Code         Ordered By   1  [place] MFM OB FOLLOW UP                  76816.01     RAVI SHANKAR   2  [place] MFM FETAL BPP WO NON              76819.01     KELLY DAVIS      STRESS  ----------------------------------------------------------------------   #  Order #                    Accession #                 Episode #   1  [id number]                  [id number]                  [id number]   2  [id number]                  [id number]                  [id number]  ---------------------------------------------------------------------- Indications   Hypertension - Chronic/Pre-existing (Low       O10.019   dose aspirin)   Advanced maternal age multigravida 87+,        O33.523   third trimester   Encounter for other antenatal screening        Z36.2   follow-up   [redacted] weeks gestation of pregnancy                Z3A.32  ---------------------------------------------------------------------- Vital Signs  Weight (lb): 181                               Height:        5'8"  BMI:         27.52 ---------------------------------------------------------------------- Fetal Evaluation  Num Of Fetuses:         1  Fetal Heart Rate(bpm):  148  Cardiac Activity:       Observed  Presentation:           Cephalic  Placenta:               Posterior  P. Cord Insertion:      Previously Visualized  Amniotic Fluid  AFI FV:      Subjectively low-normal  AFI Sum(cm)     %Tile       Largest Pocket(cm)  8.47            6           3.61  RUQ(cm)  LUQ(cm)        LLQ(cm)  1.8                         3.06           3.61 ---------------------------------------------------------------------- Biophysical Evaluation  Amniotic F.V:   Within normal limits       F. Tone:        Observed  F. Movement:    Observed                   Score:          8/8  F. Breathing:   Observed ---------------------------------------------------------------------- Biometry  BPD:      82.3  mm     G. Age:  33w 1d         50  %    CI:        74.91   %    70 - 86                                                           FL/HC:      19.8   %    19.9 - 21.5  HC:      301.7  mm     G. Age:  33w 3d         28  %    HC/AC:      0.97        0.96 - 1.11  AC:       310   mm     G. Age:  35w 0d         95  %    FL/BPD:     72.4   %    71 - 87  FL:       59.6  mm     G. Age:  31w 0d          5  %    FL/AC:      19.2   %    20 - 24  Est. FW:    2221  gm    4 lb 14 oz      63  % ---------------------------------------------------------------------- OB History  Gravidity:    4         Term:   3        Prem:   0        SAB:   0  TOP:          0       Ectopic:  0        Living: 3 ---------------------------------------------------------------------- Gestational Age  LMP:           32w 6d        Date:  12/26/18                 EDD:   10/02/19  U/S Today:     33w 1d                                        EDD:   09/30/19  Best:  32w 6d     Det. By:  LMP  (12/26/18)          EDD:   10/02/19 ---------------------------------------------------------------------- Anatomy  Cranium:               Appears normal         LVOT:                   Previously seen  Cavum:                 Previously seen        Aortic Arch:            Previously seen  Ventricles:            Previously seen        Ductal Arch:            Previously seen  Choroid Plexus:        Previously seen        Diaphragm:              Previously seen  Cerebellum:            Previously seen        Stomach:                Appears normal, left                                                                        sided  Posterior Fossa:       Previously seen        Abdomen:                Previously seen  Nuchal Fold:           Previously seen        Abdominal Wall:         Previously seen  Face:                  Orbits and profile     Cord Vessels:           Previously seen                         previously seen  Lips:                  Appears normal         Kidneys:                Appear normal  Palate:                Previously seen         Bladder:                Appears normal  Thoracic:              Appears normal         Spine:                  Previously seen  Heart:                 Appears normal  Upper Extremities:      Previously seen                         (4CH, axis, and                         situs)  RVOT:                  Previously seen        Lower Extremities:      Previously seen  Other:  Heels, 5th digit, and Nasal bone previously visualized. Technically          difficult due to fetal position. ---------------------------------------------------------------------- Cervix Uterus Adnexa  Cervix  Not visualized (advanced GA >24wks) ---------------------------------------------------------------------- Comments  This patient was seen for a follow up growth scan due to a  history of chronic hypertension that is not currently treated  with any medications.  She is taking a daily baby aspirin for  preeclampsia prophylaxis.  She denies any problems since  her last exam.  She was informed that the fetal growth and amniotic fluid  level appears appropriate for her gestational age.  A biophysical profile performed today due to chronic  hypertension was 8 out of 8.  The patient was advised that due to chronic hypertension, we  will continue to follow her with weekly fetal testing (BPP).  A  follow-up growth scan was scheduled in 4 weeks.  The patient understands that should her blood pressures be  elevated, delivery will be recommended at 37 weeks or  greater. ----------------------------------------------------------------------                        Ma Rings, MD Electronically Signed Corrected Final Report  08/14/2019 03:14 pm ----------------------------------------------------------------------  [place] Mfm Ob Follow Up  Result Date: 08/14/2019 ----------------------------------------------------------------------  OBSTETRICS REPORT                    (Corrected Final 08/14/2019 03:14 pm)  ---------------------------------------------------------------------- Patient Info  ID #:       [id number]                          D.O.B.:  1981-09-11 (37 yrs)  Name:       Shelley Rojas            Visit Date: 08/13/2019 11:02 am ---------------------------------------------------------------------- Performed By  Performed By:     Aundra Millet            Ref. Address:     [address]                                                             [phone]  [place]                                                             [address]  Attending:        Ma Rings MD         [facility]  Referred By:      [facility] ---------------------------------------------------------------------- Orders   #  Description                          Code         Ordered By   1  [place] MFM OB FOLLOW UP                  76816.01     RAVI SHANKAR   2  [place] MFM FETAL BPP WO NON              76819.01     KELLY DAVIS      STRESS  ----------------------------------------------------------------------   #  Order #                    Accession #                 Episode #   1  [id number]                  [id number]   2  [id number]                  [id number]                  [id number]  ---------------------------------------------------------------------- Indications   Hypertension - Chronic/Pre-existing (Low       O10.019   dose aspirin)   Advanced maternal age multigravida 58+,        O52.523   third trimester   Encounter for other antenatal screening        Z36.2   follow-up   [redacted] weeks gestation of pregnancy                Z3A.32  ---------------------------------------------------------------------- Vital Signs  Weight (lb): 181                               Height:         5'8"  BMI:         27.52 ---------------------------------------------------------------------- Fetal Evaluation  Num Of Fetuses:         1  Fetal Heart Rate(bpm):  148  Cardiac Activity:       Observed  Presentation:           Cephalic  Placenta:  Posterior  P. Cord Insertion:      Previously Visualized  Amniotic Fluid  AFI FV:      Subjectively low-normal  AFI Sum(cm)     %Tile       Largest Pocket(cm)  8.47            6           3.61  RUQ(cm)                     LUQ(cm)        LLQ(cm)  1.8                         3.06           3.61 ---------------------------------------------------------------------- Biophysical Evaluation  Amniotic F.V:   Within normal limits       F. Tone:        Observed  F. Movement:    Observed                   Score:          8/8  F. Breathing:   Observed ---------------------------------------------------------------------- Biometry  BPD:      82.3  mm     G. Age:  33w 1d         50  %    CI:        74.91   %    70 - 86                                                          FL/HC:      19.8   %    19.9 - 21.5  HC:      301.7  mm     G. Age:  33w 3d         28  %    HC/AC:      0.97        0.96 - 1.11  AC:       310   mm     G. Age:  35w 0d         95  %    FL/BPD:     72.4   %    71 - 87  FL:       59.6  mm     G. Age:  31w 0d          5  %    FL/AC:      19.2   %    20 - 24  Est. FW:    2221  gm    4 lb 14 oz      63  % ---------------------------------------------------------------------- OB History  Gravidity:    4         Term:   3        Prem:   0        SAB:   0  TOP:          0       Ectopic:  0        Living: 3 ---------------------------------------------------------------------- Gestational Age  LMP:           32w 6d  Date:  12/26/18                 EDD:   10/02/19  U/S Today:     33w 1d                                        EDD:   09/30/19  Best:          32w 6d     Det. By:  LMP  (12/26/18)          EDD:   10/02/19  ---------------------------------------------------------------------- Anatomy  Cranium:               Appears normal         LVOT:                   Previously seen  Cavum:                 Previously seen        Aortic Arch:            Previously seen  Ventricles:            Previously seen        Ductal Arch:            Previously seen  Choroid Plexus:        Previously seen        Diaphragm:              Previously seen  Cerebellum:            Previously seen        Stomach:                Appears normal, left                                                                        sided  Posterior Fossa:       Previously seen        Abdomen:                Previously seen  Nuchal Fold:           Previously seen        Abdominal Wall:         Previously seen  Face:                  Orbits and profile     Cord Vessels:           Previously seen                         previously seen  Lips:                  Appears normal         Kidneys:                Appear normal  Palate:                Previously seen        Bladder:  Appears normal  Thoracic:              Appears normal         Spine:                  Previously seen  Heart:                 Appears normal         Upper Extremities:      Previously seen                         (4CH, axis, and                         situs)  RVOT:                  Previously seen        Lower Extremities:      Previously seen  Other:  Heels, 5th digit, and Nasal bone previously visualized. Technically          difficult due to fetal position. ---------------------------------------------------------------------- Cervix Uterus Adnexa  Cervix  Not visualized (advanced GA >24wks) ---------------------------------------------------------------------- Comments  This patient was seen for a follow up growth scan due to a  history of chronic hypertension that is not currently treated  with any medications.  She is taking a daily baby aspirin for  preeclampsia prophylaxis.   She denies any problems since  her last exam.  She was informed that the fetal growth and amniotic fluid  level appears appropriate for her gestational age.  A biophysical profile performed today due to chronic  hypertension was 8 out of 8.  The patient was advised that due to chronic hypertension, we  will continue to follow her with weekly fetal testing (BPP).  A  follow-up growth scan was scheduled in 4 weeks.  The patient understands that should her blood pressures be  elevated, delivery will be recommended at 37 weeks or  greater. ----------------------------------------------------------------------                        Ma Rings, MD Electronically Signed Corrected Final Report  08/14/2019 03:14 pm ----------------------------------------------------------------------   Assessment and Plan:  Pregnancy: [id number] at [redacted]w[redacted]d 1. Supervision of other normal pregnancy, antepartum Stable GBS next visit  2. Chronic hypertension during pregnancy, antepartum BP stable No S/Sx of PEC BPP tomorrow Continue with weekly testing  3. Unwanted fertility Papers signed  Preterm labor symptoms and general obstetric precautions including but not limited to vaginal bleeding, contractions, leaking of fluid and fetal movement were reviewed in detail with the patient. I discussed the assessment and treatment plan with the patient. The patient was provided an opportunity to ask questions and all were answered. The patient agreed with the plan and demonstrated an understanding of the instructions. The patient was advised to call back or seek an in-person office evaluation/go to MAU at [facility] for any urgent or concerning symptoms. Please refer to After Visit Summary for other counseling recommendations.   I provided 8 minutes of face-to-face time during this encounter.  Return in about 2 weeks (around 09/10/2019) for OB visit, face to face for GBS.  Future Appointments  Date Time Provider  Department Center  08/28/2019  3:30 PM WH-MFC NURSE WH-MFC MFC-US  08/28/2019  3:30 PM WH-MFC [place] 1 WH-MFCUS MFC-US  09/04/2019  3:30 PM  WH-MFC NURSE WH-MFC MFC-US  09/04/2019  3:30 PM WH-MFC [place] 1 WH-MFCUS MFC-US  09/11/2019  3:30 PM WH-MFC NURSE WH-MFC MFC-US  09/11/2019  3:30 PM WH-MFC [place] 1 WH-MFCUS MFC-US    Hermina Staggers, MD [facility] for [employer], [facility]

## 2019-08-27 NOTE — Progress Notes (Signed)
Pt has been changed to [employer] today due to childcare problems. Pt has BPP scheduled for tomorrow. 134/101 repeat 135/99  Pt denies any symptoms at this time.

## 2019-08-28 ENCOUNTER — Encounter (HOSPITAL_COMMUNITY): Payer: Self-pay

## 2019-08-28 ENCOUNTER — Ambulatory Visit (HOSPITAL_COMMUNITY): Payer: Medicaid Other | Admitting: *Deleted

## 2019-08-28 ENCOUNTER — Ambulatory Visit (HOSPITAL_COMMUNITY)
Admission: RE | Admit: 2019-08-28 | Discharge: 2019-08-28 | Disposition: A | Discharge status: Home / Self Care | Payer: Medicaid Other | Source: Ambulatory Visit | Attending: Obstetrics | Admitting: Obstetrics

## 2019-08-28 VITALS — BP 124/87 | HR 87 | Temp 97.9°F

## 2019-08-28 DIAGNOSIS — O10919 Unspecified pre-existing hypertension complicating pregnancy, unspecified trimester: Secondary | ICD-10-CM

## 2019-08-28 DIAGNOSIS — Z3A35 35 weeks gestation of pregnancy: Secondary | ICD-10-CM

## 2019-08-28 DIAGNOSIS — O09523 Supervision of elderly multigravida, third trimester: Secondary | ICD-10-CM

## 2019-08-28 DIAGNOSIS — O10013 Pre-existing essential hypertension complicating pregnancy, third trimester: Secondary | ICD-10-CM

## 2019-08-28 DIAGNOSIS — O09529 Supervision of elderly multigravida, unspecified trimester: Secondary | ICD-10-CM | POA: Insufficient documentation

## 2019-09-03 ENCOUNTER — Ambulatory Visit (HOSPITAL_COMMUNITY): Payer: Medicaid Other

## 2019-09-04 ENCOUNTER — Ambulatory Visit (HOSPITAL_COMMUNITY)
Admission: RE | Admit: 2019-09-04 | Discharge: 2019-09-04 | Disposition: A | Discharge status: Home / Self Care | Payer: Medicaid Other | Source: Ambulatory Visit | Attending: Obstetrics | Admitting: Obstetrics

## 2019-09-04 ENCOUNTER — Other Ambulatory Visit: Payer: Self-pay

## 2019-09-04 ENCOUNTER — Ambulatory Visit (HOSPITAL_COMMUNITY): Payer: Medicaid Other | Admitting: *Deleted

## 2019-09-04 ENCOUNTER — Encounter (HOSPITAL_COMMUNITY): Payer: Self-pay

## 2019-09-04 VITALS — BP 132/95 | HR 91 | Temp 98.3°F

## 2019-09-04 DIAGNOSIS — O10013 Pre-existing essential hypertension complicating pregnancy, third trimester: Secondary | ICD-10-CM

## 2019-09-04 DIAGNOSIS — O10919 Unspecified pre-existing hypertension complicating pregnancy, unspecified trimester: Secondary | ICD-10-CM

## 2019-09-04 DIAGNOSIS — O09523 Supervision of elderly multigravida, third trimester: Secondary | ICD-10-CM | POA: Diagnosis not present

## 2019-09-04 DIAGNOSIS — O09529 Supervision of elderly multigravida, unspecified trimester: Secondary | ICD-10-CM | POA: Diagnosis present

## 2019-09-04 DIAGNOSIS — Z3A36 36 weeks gestation of pregnancy: Secondary | ICD-10-CM

## 2019-09-10 ENCOUNTER — Ambulatory Visit (HOSPITAL_COMMUNITY): Payer: Medicaid Other

## 2019-09-11 ENCOUNTER — Ambulatory Visit (HOSPITAL_COMMUNITY): Payer: Medicaid Other | Admitting: *Deleted

## 2019-09-11 ENCOUNTER — Encounter (HOSPITAL_COMMUNITY): Payer: Self-pay

## 2019-09-11 ENCOUNTER — Ambulatory Visit (HOSPITAL_COMMUNITY)
Admission: RE | Admit: 2019-09-11 | Discharge: 2019-09-11 | Disposition: A | Discharge status: Home / Self Care | Payer: Medicaid Other | Source: Ambulatory Visit | Attending: Obstetrics | Admitting: Obstetrics

## 2019-09-11 ENCOUNTER — Other Ambulatory Visit: Payer: Self-pay

## 2019-09-11 VITALS — BP 130/101 | HR 98 | Temp 98.5°F

## 2019-09-11 DIAGNOSIS — O10013 Pre-existing essential hypertension complicating pregnancy, third trimester: Secondary | ICD-10-CM

## 2019-09-11 DIAGNOSIS — Z3A37 37 weeks gestation of pregnancy: Secondary | ICD-10-CM

## 2019-09-11 DIAGNOSIS — O09523 Supervision of elderly multigravida, third trimester: Secondary | ICD-10-CM

## 2019-09-11 DIAGNOSIS — Z362 Encounter for other antenatal screening follow-up: Secondary | ICD-10-CM

## 2019-09-11 DIAGNOSIS — O10919 Unspecified pre-existing hypertension complicating pregnancy, unspecified trimester: Secondary | ICD-10-CM | POA: Diagnosis present

## 2019-09-11 DIAGNOSIS — O09529 Supervision of elderly multigravida, unspecified trimester: Secondary | ICD-10-CM | POA: Insufficient documentation

## 2019-09-14 ENCOUNTER — Other Ambulatory Visit: Payer: Self-pay

## 2019-09-14 ENCOUNTER — Other Ambulatory Visit (HOSPITAL_COMMUNITY): Payer: Self-pay | Admitting: Advanced Practice Midwife

## 2019-09-14 ENCOUNTER — Other Ambulatory Visit (HOSPITAL_COMMUNITY)
Admission: RE | Admit: 2019-09-14 | Discharge: 2019-09-14 | Disposition: A | Discharge status: Home / Self Care | Payer: Medicaid Other | Source: Ambulatory Visit | Attending: Obstetrics & Gynecology | Admitting: Obstetrics & Gynecology

## 2019-09-14 ENCOUNTER — Ambulatory Visit (INDEPENDENT_AMBULATORY_CARE_PROVIDER_SITE_OTHER): Payer: Medicaid Other | Admitting: Obstetrics & Gynecology

## 2019-09-14 VITALS — BP 135/96 | HR 88 | Temp 98.1°F | Wt 185.6 lb

## 2019-09-14 DIAGNOSIS — Z3A37 37 weeks gestation of pregnancy: Secondary | ICD-10-CM

## 2019-09-14 DIAGNOSIS — O10913 Unspecified pre-existing hypertension complicating pregnancy, third trimester: Secondary | ICD-10-CM

## 2019-09-14 DIAGNOSIS — Z3480 Encounter for supervision of other normal pregnancy, unspecified trimester: Secondary | ICD-10-CM | POA: Diagnosis present

## 2019-09-14 DIAGNOSIS — O10919 Unspecified pre-existing hypertension complicating pregnancy, unspecified trimester: Secondary | ICD-10-CM

## 2019-09-14 DIAGNOSIS — I10 Essential (primary) hypertension: Secondary | ICD-10-CM

## 2019-09-14 NOTE — Progress Notes (Signed)
   PRENATAL VISIT NOTE  Subjective:  Shelley Rojas is a 37 y.o. [phone] at [redacted]w[redacted]d being seen today for ongoing prenatal care.  She is currently monitored for the following issues for this high-risk pregnancy and has Hypertension; Supervision of other normal pregnancy, antepartum; Chronic hypertension during pregnancy, antepartum; and Unwanted fertility on their problem list.  Patient reports no complaints.  Contractions: Not present. Vag. Bleeding: None.  Movement: Present. Denies leaking of fluid.   The following portions of the patient's history were reviewed and updated as appropriate: allergies, current medications, past family history, past medical history, past social history, past surgical history and problem list.   Objective:   Vitals:   09/14/19 1627 09/14/19 1629  BP: (!) 141/96 (!) 135/96  Pulse: 86 88  Temp: 98.1 F (36.7 C)   Weight: 185 lb 9.6 oz (84.2 kg)     Fetal Status: Fetal Heart Rate (bpm): 151   Movement: Present     General:  Alert, oriented and cooperative. Patient is in no acute distress.  Skin: Skin is warm and dry. No rash noted.   Cardiovascular: Normal heart rate noted  Respiratory: Normal respiratory effort, no problems with respiration noted  Abdomen: Soft, gravid, appropriate for gestational age.  Pain/Pressure: Present     Pelvic: Cervical exam performed        Extremities: Normal range of motion.  Edema: None  Mental Status: Normal mood and affect. Normal behavior. Normal judgment and thought content.   Assessment and Plan:  Pregnancy: G4P3003 at [redacted]w[redacted]d 1. Supervision of other normal pregnancy, antepartum FHR and FM are WNL  2. Essential hypertension Pt taking baby ASA  3. Chronic hypertension during pregnancy, antepartum BP elevated. Not in severe range.  Will schedule IOL for 38 weeks.   Reviewed new guidelines for IOL Orders placed.   Term labor symptoms and general obstetric precautions including but not limited to vaginal  bleeding, contractions, leaking of fluid and fetal movement were reviewed in detail with the patient. Please refer to After Visit Summary for other counseling recommendations.   No follow-ups on file.  No future appointments.  Lavonia Drafts, MD

## 2019-09-15 ENCOUNTER — Encounter (HOSPITAL_COMMUNITY): Payer: Self-pay | Admitting: *Deleted

## 2019-09-15 ENCOUNTER — Telehealth (HOSPITAL_COMMUNITY): Payer: Self-pay | Admitting: *Deleted

## 2019-09-15 NOTE — Telephone Encounter (Signed)
Preadmission screen  

## 2019-09-16 ENCOUNTER — Other Ambulatory Visit: Payer: Self-pay | Admitting: Advanced Practice Midwife

## 2019-09-16 ENCOUNTER — Other Ambulatory Visit: Payer: Self-pay

## 2019-09-16 ENCOUNTER — Other Ambulatory Visit (HOSPITAL_COMMUNITY)
Admission: RE | Admit: 2019-09-16 | Discharge: 2019-09-16 | Disposition: A | Discharge status: Home / Self Care | Payer: Medicaid Other | Source: Ambulatory Visit | Attending: Obstetrics and Gynecology | Admitting: Obstetrics and Gynecology

## 2019-09-16 DIAGNOSIS — Z20828 Contact with and (suspected) exposure to other viral communicable diseases: Secondary | ICD-10-CM | POA: Diagnosis not present

## 2019-09-16 DIAGNOSIS — Z01812 Encounter for preprocedural laboratory examination: Secondary | ICD-10-CM | POA: Diagnosis present

## 2019-09-16 LAB — STREP GP B NAA: Strep Gp B NAA: NEGATIVE

## 2019-09-16 LAB — SARS CORONAVIRUS 2 BY RT PCR (HOSPITAL ORDER, PERFORMED IN ~~LOC~~ HOSPITAL LAB): SARS Coronavirus 2: NEGATIVE

## 2019-09-16 NOTE — MAU Note (Signed)
Pt reported no symptoms and tolerated swab.  

## 2019-09-17 LAB — CERVICOVAGINAL ANCILLARY ONLY
Chlamydia: NEGATIVE
Neisseria Gonorrhea: NEGATIVE

## 2019-09-18 ENCOUNTER — Inpatient Hospital Stay (HOSPITAL_COMMUNITY): Payer: Medicaid Other | Admitting: Anesthesiology

## 2019-09-18 ENCOUNTER — Inpatient Hospital Stay (HOSPITAL_COMMUNITY): Payer: Medicaid Other

## 2019-09-18 ENCOUNTER — Encounter (HOSPITAL_COMMUNITY): Payer: Self-pay | Admitting: Obstetrics

## 2019-09-18 ENCOUNTER — Other Ambulatory Visit: Payer: Self-pay

## 2019-09-18 ENCOUNTER — Inpatient Hospital Stay (HOSPITAL_COMMUNITY)
Admission: AD | Admit: 2019-09-18 | Discharge: 2019-09-20 | DRG: 807 | Disposition: A | Discharge status: Home / Self Care | Payer: Medicaid Other | Attending: Obstetrics and Gynecology | Admitting: Obstetrics and Gynecology

## 2019-09-18 DIAGNOSIS — O1002 Pre-existing essential hypertension complicating childbirth: Secondary | ICD-10-CM | POA: Diagnosis present

## 2019-09-18 DIAGNOSIS — O134 Gestational [pregnancy-induced] hypertension without significant proteinuria, complicating childbirth: Secondary | ICD-10-CM

## 2019-09-18 DIAGNOSIS — O10919 Unspecified pre-existing hypertension complicating pregnancy, unspecified trimester: Secondary | ICD-10-CM

## 2019-09-18 DIAGNOSIS — Z3A38 38 weeks gestation of pregnancy: Secondary | ICD-10-CM

## 2019-09-18 DIAGNOSIS — Z3480 Encounter for supervision of other normal pregnancy, unspecified trimester: Secondary | ICD-10-CM

## 2019-09-18 DIAGNOSIS — Z87891 Personal history of nicotine dependence: Secondary | ICD-10-CM | POA: Diagnosis not present

## 2019-09-18 DIAGNOSIS — O10019 Pre-existing essential hypertension complicating pregnancy, unspecified trimester: Secondary | ICD-10-CM | POA: Diagnosis present

## 2019-09-18 DIAGNOSIS — Z30430 Encounter for insertion of intrauterine contraceptive device: Secondary | ICD-10-CM | POA: Diagnosis not present

## 2019-09-18 LAB — CBC
HCT: 33.1 % — ABNORMAL LOW (ref 36.0–46.0)
HCT: 30.8 % — ABNORMAL LOW (ref 36.0–46.0)
Hemoglobin: 10.9 g/dL — ABNORMAL LOW (ref 12.0–15.0)
Hemoglobin: 9.9 g/dL — ABNORMAL LOW (ref 12.0–15.0)
MCH: 29.1 pg (ref 26.0–34.0)
MCH: 28.9 pg (ref 26.0–34.0)
MCHC: 32.9 g/dL (ref 30.0–36.0)
MCHC: 32.1 g/dL (ref 30.0–36.0)
MCV: 88.5 fL (ref 80.0–100.0)
MCV: 89.8 fL (ref 80.0–100.0)
Platelets: 232 10*3/uL (ref 150–400)
Platelets: 219 10*3/uL (ref 150–400)
RBC: 3.74 MIL/uL — ABNORMAL LOW (ref 3.87–5.11)
RBC: 3.43 MIL/uL — ABNORMAL LOW (ref 3.87–5.11)
RDW: 14.1 % (ref 11.5–15.5)
RDW: 14.2 % (ref 11.5–15.5)
WBC: 10.1 10*3/uL (ref 4.0–10.5)
WBC: 16 10*3/uL — ABNORMAL HIGH (ref 4.0–10.5)
nRBC: 0 % (ref 0.0–0.2)
nRBC: 0 % (ref 0.0–0.2)

## 2019-09-18 LAB — RPR: RPR Ser Ql: NONREACTIVE

## 2019-09-18 LAB — COMPREHENSIVE METABOLIC PANEL
ALT: 13 U/L (ref 0–44)
AST: 16 U/L (ref 15–41)
Albumin: 2.7 g/dL — ABNORMAL LOW (ref 3.5–5.0)
Alkaline Phosphatase: 101 U/L (ref 38–126)
Anion gap: 11 (ref 5–15)
BUN: <5 mg/dL — ABNORMAL LOW (ref 6–20)
CO2: 17 mmol/L — ABNORMAL LOW (ref 22–32)
Calcium: 8.7 mg/dL — ABNORMAL LOW (ref 8.9–10.3)
Chloride: 108 mmol/L (ref 98–111)
Creatinine, Ser: 0.67 mg/dL (ref 0.44–1.00)
GFR calc Af Amer: >60 mL/min (ref >60)
GFR calc non Af Amer: >60 mL/min (ref >60)
Glucose, Bld: 91 mg/dL (ref 70–99)
Potassium: 3.4 mmol/L — ABNORMAL LOW (ref 3.5–5.1)
Sodium: 136 mmol/L (ref 135–145)
Total Bilirubin: 0.7 mg/dL (ref 0.3–1.2)
Total Protein: 6.2 g/dL — ABNORMAL LOW (ref 6.5–8.1)

## 2019-09-18 LAB — TYPE AND SCREEN
ABO/RH(D): A POS
Antibody Screen: NEGATIVE

## 2019-09-18 LAB — PROTEIN / CREATININE RATIO, URINE
Creatinine, Urine: 38.94 mg/dL
Protein Creatinine Ratio: 0.28 mg/mg{Cre} — ABNORMAL HIGH (ref 0.00–0.15)
Total Protein, Urine: 11 mg/dL

## 2019-09-18 LAB — ABO/RH: ABO/RH(D): A POS

## 2019-09-18 MED ORDER — OXYCODONE-ACETAMINOPHEN 5-325 MG PO TABS: 1.0000 | ORAL_TABLET | ORAL | Status: DC | PRN

## 2019-09-18 MED ORDER — FENTANYL-BUPIVACAINE-NACL 0.5-0.125-0.9 MG/250ML-% EP SOLN: 12.0000 mL/h | EPIDURAL | Status: DC | PRN

## 2019-09-18 MED ORDER — EPHEDRINE 5 MG/ML INJ: 10.0000 mg | INTRAVENOUS | Status: DC | PRN

## 2019-09-18 MED ORDER — OXYTOCIN 40 UNITS IN NORMAL SALINE INFUSION - SIMPLE MED: 2.5000 [IU]/h | INTRAVENOUS | Status: DC

## 2019-09-18 MED ORDER — OXYTOCIN BOLUS FROM INFUSION
500.0000 mL | Freq: Once | INTRAVENOUS | Status: AC
  Administered 2019-09-18: 500 mL via INTRAVENOUS

## 2019-09-18 MED ORDER — FENTANYL CITRATE (PF) 100 MCG/2ML IJ SOLN
INTRAMUSCULAR | Status: AC
  Filled 2019-09-18: qty 2

## 2019-09-18 MED ORDER — LEVONORGESTREL 19.5 MCG/DAY IU IUD
INTRAUTERINE_SYSTEM | Freq: Once | INTRAUTERINE | Status: AC
  Administered 2019-09-18: 18:00:00 1 via INTRAUTERINE

## 2019-09-18 MED ORDER — SIMETHICONE 80 MG PO CHEW: 80.0000 mg | CHEWABLE_TABLET | ORAL | Status: DC | PRN

## 2019-09-18 MED ORDER — OXYTOCIN 40 UNITS IN NORMAL SALINE INFUSION - SIMPLE MED
1.0000 m[IU]/min | INTRAVENOUS | Status: DC
  Administered 2019-09-18: 2 m[IU]/min via INTRAVENOUS
  Filled 2019-09-18: qty 1000

## 2019-09-18 MED ORDER — TETANUS-DIPHTH-ACELL PERTUSSIS 5-2.5-18.5 LF-MCG/0.5 IM SUSP: 0.5000 mL | Freq: Once | INTRAMUSCULAR | Status: DC

## 2019-09-18 MED ORDER — SOD CITRATE-CITRIC ACID 500-334 MG/5ML PO SOLN: 30.0000 mL | ORAL | Status: DC | PRN

## 2019-09-18 MED ORDER — FENTANYL-BUPIVACAINE-NACL 0.5-0.125-0.9 MG/250ML-% EP SOLN
12.0000 mL/h | EPIDURAL | Status: DC | PRN
  Filled 2019-09-18: qty 250

## 2019-09-18 MED ORDER — PHENYLEPHRINE 40 MCG/ML (10ML) SYRINGE FOR IV PUSH (FOR BLOOD PRESSURE SUPPORT): 80.0000 ug | PREFILLED_SYRINGE | INTRAVENOUS | Status: DC | PRN

## 2019-09-18 MED ORDER — LEVONORGESTREL 19.5 MCG/DAY IU IUD
INTRAUTERINE_SYSTEM | INTRAUTERINE | Status: AC
  Filled 2019-09-18: qty 1

## 2019-09-18 MED ORDER — LIDOCAINE-EPINEPHRINE 2 %-1:100000 IJ SOLN
INTRAMUSCULAR | Status: DC | PRN
  Administered 2019-09-18: 6 mL

## 2019-09-18 MED ORDER — ACETAMINOPHEN 325 MG PO TABS
650.0000 mg | ORAL_TABLET | ORAL | Status: DC | PRN
  Administered 2019-09-18: 650 mg via ORAL
  Filled 2019-09-18: qty 2

## 2019-09-18 MED ORDER — LACTATED RINGERS IV SOLN
INTRAVENOUS | Status: DC
  Administered 2019-09-18 (×2): via INTRAVENOUS

## 2019-09-18 MED ORDER — ONDANSETRON HCL 4 MG PO TABS
4.0000 mg | ORAL_TABLET | ORAL | Status: DC | PRN
  Administered 2019-09-19 (×2): 4 mg via ORAL
  Filled 2019-09-18 (×2): qty 1

## 2019-09-18 MED ORDER — ONDANSETRON HCL 4 MG/2ML IJ SOLN: 4.0000 mg | INTRAMUSCULAR | Status: DC | PRN

## 2019-09-18 MED ORDER — ASPIRIN EC 81 MG PO TBEC
81.0000 mg | DELAYED_RELEASE_TABLET | Freq: Every day | ORAL | Status: DC
  Administered 2019-09-19 – 2019-09-20 (×2): 81 mg via ORAL
  Filled 2019-09-18 (×2): qty 1

## 2019-09-18 MED ORDER — DIPHENHYDRAMINE HCL 50 MG/ML IJ SOLN: 12.5000 mg | INTRAMUSCULAR | Status: DC | PRN

## 2019-09-18 MED ORDER — COCONUT OIL OIL: 1.0000 "application " | TOPICAL_OIL | Status: DC | PRN

## 2019-09-18 MED ORDER — LIDOCAINE HCL (PF) 1 % IJ SOLN
INTRAMUSCULAR | Status: DC | PRN
  Administered 2019-09-18: 6 mL via EPIDURAL

## 2019-09-18 MED ORDER — ACETAMINOPHEN 325 MG PO TABS
650.0000 mg | ORAL_TABLET | ORAL | Status: DC | PRN
  Administered 2019-09-19: 650 mg via ORAL
  Filled 2019-09-18: qty 2

## 2019-09-18 MED ORDER — DIPHENHYDRAMINE HCL 25 MG PO CAPS: 25.0000 mg | ORAL_CAPSULE | Freq: Four times a day (QID) | ORAL | Status: DC | PRN

## 2019-09-18 MED ORDER — LIDOCAINE HCL (PF) 1 % IJ SOLN: 30.0000 mL | INTRAMUSCULAR | Status: DC | PRN

## 2019-09-18 MED ORDER — IBUPROFEN 600 MG PO TABS
600.0000 mg | ORAL_TABLET | Freq: Four times a day (QID) | ORAL | Status: DC
  Administered 2019-09-18 – 2019-09-20 (×7): 600 mg via ORAL
  Filled 2019-09-18 (×7): qty 1

## 2019-09-18 MED ORDER — SODIUM CHLORIDE (PF) 0.9 % IJ SOLN
INTRAMUSCULAR | Status: DC | PRN
  Administered 2019-09-18: 12 mL/h via EPIDURAL

## 2019-09-18 MED ORDER — WITCH HAZEL-GLYCERIN EX PADS: 1.0000 "application " | MEDICATED_PAD | CUTANEOUS | Status: DC | PRN

## 2019-09-18 MED ORDER — FENTANYL CITRATE (PF) 100 MCG/2ML IJ SOLN
100.0000 ug | INTRAMUSCULAR | Status: AC
  Administered 2019-09-18: 100 ug via INTRAVENOUS

## 2019-09-18 MED ORDER — LACTATED RINGERS IV SOLN: 500.0000 mL | INTRAVENOUS | Status: DC | PRN

## 2019-09-18 MED ORDER — ONDANSETRON HCL 4 MG/2ML IJ SOLN
4.0000 mg | Freq: Four times a day (QID) | INTRAMUSCULAR | Status: DC | PRN
  Administered 2019-09-18: 4 mg via INTRAVENOUS
  Filled 2019-09-18: qty 2

## 2019-09-18 MED ORDER — BENZOCAINE-MENTHOL 20-0.5 % EX AERO
1.0000 "application " | INHALATION_SPRAY | CUTANEOUS | Status: DC | PRN
  Administered 2019-09-19: 1 via TOPICAL
  Filled 2019-09-18: qty 56

## 2019-09-18 MED ORDER — OXYCODONE-ACETAMINOPHEN 5-325 MG PO TABS: 2.0000 | ORAL_TABLET | ORAL | Status: DC | PRN

## 2019-09-18 MED ORDER — PRENATAL MULTIVITAMIN CH
1.0000 | ORAL_TABLET | Freq: Every day | ORAL | Status: DC
  Administered 2019-09-19: 1 via ORAL
  Filled 2019-09-18: qty 1

## 2019-09-18 MED ORDER — ZOLPIDEM TARTRATE 5 MG PO TABS: 5.0000 mg | ORAL_TABLET | Freq: Every evening | ORAL | Status: DC | PRN

## 2019-09-18 MED ORDER — TERBUTALINE SULFATE 1 MG/ML IJ SOLN: 0.2500 mg | Freq: Once | INTRAMUSCULAR | Status: DC | PRN

## 2019-09-18 MED ORDER — SENNOSIDES-DOCUSATE SODIUM 8.6-50 MG PO TABS
2.0000 | ORAL_TABLET | ORAL | Status: DC
  Administered 2019-09-19 – 2019-09-20 (×2): 2 via ORAL
  Filled 2019-09-18 (×2): qty 2

## 2019-09-18 MED ORDER — BUTORPHANOL TARTRATE 1 MG/ML IJ SOLN: 1.0000 mg | INTRAMUSCULAR | Status: DC | PRN

## 2019-09-18 MED ORDER — FENTANYL CITRATE (PF) 100 MCG/2ML IJ SOLN
INTRAMUSCULAR | Status: DC | PRN
  Administered 2019-09-18: 100 ug via EPIDURAL

## 2019-09-18 MED ORDER — LACTATED RINGERS IV SOLN
500.0000 mL | Freq: Once | INTRAVENOUS | Status: AC
  Administered 2019-09-18: 500 mL via INTRAVENOUS

## 2019-09-18 MED ORDER — DIBUCAINE (PERIANAL) 1 % EX OINT: 1.0000 "application " | TOPICAL_OINTMENT | CUTANEOUS | Status: DC | PRN

## 2019-09-18 NOTE — Discharge Summary (Signed)
Postpartum Discharge Summary    Patient Name: Shelley Rojas DOB: 05-28-1981 MRN: [id number]  Date of admission: 09/18/2019 Delivering Provider: Laury Deep   Date of discharge: 09/20/2019  Admitting diagnosis: Pregnancy Intrauterine pregnancy: [redacted]w[redacted]d    Secondary diagnosis:  Active Problems:   Chronic benign essential hypertension, antepartum  Additional problems: none     Discharge diagnosis: Term Pregnancy Delivered and CHTN                                                                                                Post partum procedures: none  Augmentation: AROM  Complications: None  Hospital course:  Induction of Labor With Vaginal Delivery   38y.o. yo [id number] admitted to the hospital 09/18/2019 for induction of labor.  Indication for induction: cHTN.  Patient had an uncomplicated labor course as follows: Membrane Rupture Time/Date: 4:10 PM ,09/18/2019   Intrapartum Procedures: Episiotomy: None [1]                                         Lacerations:  None [1]  Patient had delivery of a Viable infant.  Information for the patient's newborn:  PaPaloma, Grangeirl HeJanell0[[id number]]Delivery Method: Vag-Spont    09/18/2019  Details of delivery can be found in separate delivery note.  Patient had a routine postpartum course. Patient is discharged home 09/20/19. Delivery time: 5:35 PM    Magnesium Sulfate received: No BMZ received: No Rhophylac:N/A MMR:N/A Transfusion:No  Physical exam  Vitals:   09/19/19 1740 09/19/19 2304 09/20/19 0647 09/20/19 0905  BP: 129/83 112/81 102/75 (!) 152/94  Pulse: (!) 54 (!) 51 (!) 57   Resp:  18 18   Temp:  (!) 97.5 F (36.4 C) 98.4 F (36.9 C)   TempSrc:  Oral Oral   SpO2:  100% 100%    General: alert, cooperative and no distress Lochia: appropriate Uterine Fundus: firm Incision: N/A DVT Evaluation: No evidence of DVT seen on physical exam. Labs: Lab Results  Component Value Date   WBC 16.0 (H)  09/18/2019   HGB 9.9 (L) 09/18/2019   HCT 30.8 (L) 09/18/2019   MCV 89.8 09/18/2019   PLT 219 09/18/2019   CMP Latest Ref Rng & Units 09/18/2019  Glucose 70 - 99 mg/dL 91  BUN 6 - 20 mg/dL <5(L)  Creatinine 0.44 - 1.00 mg/dL 0.67  Sodium 135 - 145 mmol/L 136  Potassium 3.5 - 5.1 mmol/L 3.4(L)  Chloride 98 - 111 mmol/L 108  CO2 22 - 32 mmol/L 17(L)  Calcium 8.9 - 10.3 mg/dL 8.7(L)  Total Protein 6.5 - 8.1 g/dL 6.2(L)  Total Bilirubin 0.3 - 1.2 mg/dL 0.7  Alkaline Phos 38 - 126 U/L 101  AST 15 - 41 U/L 16  ALT 0 - 44 U/L 13    Discharge instruction: per After Visit Summary and "Baby and Me Booklet".  After visit meds:  Allergies as of 09/20/2019   No Known Allergies     Medication  List    TAKE these medications   amLODipine 10 MG tablet Commonly known as: NORVASC Take 1 tablet (10 mg total) by mouth daily. Start taking on: September 21, 2019   aspirin EC 81 MG tablet Take 1 tablet (81 mg total) by mouth daily.   B-6 PO Take by mouth.   ibuprofen 800 MG tablet Commonly known as: ADVIL Take 1 tablet (800 mg total) by mouth every 8 (eight) hours as needed.   multivitamin-prenatal 27-0.8 MG Tabs tablet Take 1 tablet by mouth daily at 12 noon.   UNISOM PO Take by mouth.       Diet: routine diet  Activity: Advance as tolerated. Pelvic rest for 6 weeks.   Outpatient follow up:5 days for BP check  Follow up Appt:No future appointments. Follow up Visit: DC home on Norvasc 36m daily. Message sent to [facility] to schedule patient for BP check later this week.    Please schedule this patient for Postpartum visit in: 1 week with the following provider: MD For C/S patients schedule nurse incision check in weeks 2 weeks: no High risk pregnancy complicated by: HTN Delivery mode:  SVD Anticipated Birth Control: pp IUD placed on 09/18/2019 PP Procedures needed: BP check  Schedule Integrated BLehivisit: no   Newborn Data: Live born female "Jayden" Birth Weight: 7 lb  13.4 oz (3555 g) APGAR: 973 9  Newborn Delivery   Birth date/time: 09/18/2019 17:35:00 Delivery type: Vaginal, Spontaneous      Baby Feeding: Bottle Disposition:home with mother  HArneda SappingtonDNP, CNM  09/20/19  10:32 AM

## 2019-09-18 NOTE — Anesthesia Preprocedure Evaluation (Signed)
Anesthesia Evaluation  Patient identified by MRN, date of birth, ID band Patient awake    Reviewed: Allergy & Precautions, H&P , NPO status , Patient's Chart, lab work & pertinent test results, reviewed documented beta blocker date and time   History of Anesthesia Complications (+) history of anesthetic complications  Airway Mallampati: II  TM Distance: >3 FB Neck ROM: full    Dental no notable dental hx.    Pulmonary neg pulmonary ROS, former smoker,    Pulmonary exam normal breath sounds clear to auscultation       Cardiovascular hypertension, negative cardio ROS Normal cardiovascular exam Rhythm:regular Rate:Normal     Neuro/Psych negative neurological ROS  negative psych ROS   GI/Hepatic negative GI ROS, Neg liver ROS,   Endo/Other  negative endocrine ROS  Renal/GU negative Renal ROS  negative genitourinary   Musculoskeletal   Abdominal   Peds  Hematology negative hematology ROS (+)   Anesthesia Other Findings   Reproductive/Obstetrics (+) Pregnancy                             Anesthesia Physical Anesthesia Plan  ASA: II  Anesthesia Plan: Epidural   Post-op Pain Management:    Induction:   PONV Risk Score and Plan:   Airway Management Planned:   Additional Equipment:   Intra-op Plan:   Post-operative Plan:   Informed Consent: I have reviewed the patients History and Physical, chart, labs and discussed the procedure including the risks, benefits and alternatives for the proposed anesthesia with the patient or authorized representative who has indicated his/her understanding and acceptance.       Plan Discussed with:   Anesthesia Plan Comments:         Anesthesia Quick Evaluation

## 2019-09-18 NOTE — H&P (Signed)
OBSTETRIC ADMISSION HISTORY AND PHYSICAL  Shelley Rojas is a 38 y.o. female [phone] with IUP at [redacted]w[redacted]d by 14wk ultrasound presenting for IOL for cHTN. She reports +FMs, No LOF, no VB, no blurry vision, headaches or peripheral edema, and RUQ pain.  She plans on bottle feeding. She request BTL or PP IUD for birth control. She received her prenatal care at [facility].  Dating: By Ultrasound-->  Estimated Date of Delivery: 10/02/19  Sono:  09/11/19  @[redacted]w[redacted]d , CWD, normal anatomy, cephalic presentation, posterior placental lie, 3176g, 65% EFW  Last BPP 8/8 (09/11/19)   Prenatal History/Complications: cHTN controlled Advanced maternal age +46  Past Medical History: Past Medical History:  Diagnosis Date  . Hypertension   . PONV (postoperative nausea and vomiting)     Past Surgical History: Past Surgical History:  Procedure Laterality Date  . NO PAST SURGERIES      Obstetrical History: OB History    Gravida  4   Para  3   Term  3   Preterm  0   AB  0   Living  3     SAB  0   TAB  0   Ectopic  0   Multiple  0   Live Births  3           Social History: Social History   Socioeconomic History  . Marital status: Married    Spouse name: Not on file  . Number of children: Not on file  . Years of education: Not on file  . Highest education level: Not on file  Occupational History  . Not on file  Social Needs  . Financial resource strain: Not hard at all  . Food insecurity    Worry: Never true    Inability: Never true  . Transportation needs    Medical: No    Non-medical: No  Tobacco Use  . Smoking status: Former Smoker    Packs/day: 0.50    Types: Cigarettes  . Smokeless tobacco: Never Used  Substance and Sexual Activity  . Alcohol use: Not Currently    Comment: sometimes  . Drug use: No  . Sexual activity: Yes    Birth control/protection: None  Lifestyle  . Physical activity    Days per week: Not on file    Minutes per session: Not on  file  . Stress: Not on file  Relationships  . Social Herbalist on phone: Not on file    Gets together: Not on file    Attends religious service: Not on file    Active member of club or organization: Not on file    Attends meetings of clubs or organizations: Not on file    Relationship status: Not on file  Other Topics Concern  . Not on file  Social History Narrative  . Not on file    Family History: Family History  Problem Relation Age of Onset  . Early death Mother        mva  . Heart disease Father   . Hyperlipidemia Father   . Arthritis Paternal Grandmother   . Hyperlipidemia Paternal Grandmother   . Hyperlipidemia Paternal Grandfather     Allergies: No Known Allergies  Medications Prior to Admission  Medication Sig Dispense Refill Last Dose  . aspirin EC 81 MG tablet Take 1 tablet (81 mg total) by mouth daily. 100 tablet 2   . Doxylamine Succinate, Sleep, (UNISOM PO) Take by mouth.     Marland Kitchen  Prenatal Vit-Fe Fumarate-FA (MULTIVITAMIN-PRENATAL) 27-0.8 MG TABS tablet Take 1 tablet by mouth daily at 12 noon. 30 each 12   . Pyridoxine HCl (B-6 PO) Take by mouth.        Review of Systems   All systems reviewed and negative except as stated in HPI  Blood pressure (!) 158/103, pulse 70, temperature 98.2 F (36.8 C), temperature source Oral, resp. rate 17, last menstrual period 12/26/2018. General appearance: alert, cooperative and mild distress Lungs: normal effort Heart: regular rate  Abdomen: soft, non-tender; bowel sounds normal Pelvic: gravid uterus GU: No vaginal lesions  Extremities: Homans sign is negative, no sign of DVT DTR's normal Presentation: cephalic Fetal monitoringBaseline: 135 bpm, Variability: Good {> 6 bpm), Accelerations: Reactive and Decelerations: Absent Uterine activityFrequency: Every 2-3 minutes, Duration: 50-80 seconds and Intensity: mild Dilation: 3.5 Effacement (%): 70 Station: -2 Exam by:: stone rnc   Prenatal labs: ABO,  Rh: A POS (10/09 0830) Antibody: NEG (10/09 0830) Rubella: IMMUNE (04/09 1425) RPR: Non Reactive (08/05 1006)  HBsAg: Negative (04/09 1425)  HIV: Non Reactive (08/05 1006)  GBS: Negative (10/05 0446)  2 hr Glucola normal 87-100-128 Genetic screening  normal Anatomy US normal  Prenatal Transfer Tool  Maternal Diabetes: No Genetic Screening: Normal Maternal Ultrasounds/Referrals: Normal Fetal Ultrasounds or other Referrals:  None Maternal Substance Abuse:  No Significant Maternal Medications:  Meds include: Other: bASA daily Significant Maternal Lab Results: Group B Strep negative  Results for orders placed or performed during the hospital encounter of 09/18/19 (from the past 24 hour(s))  CBC   Collection Time: 09/18/19  8:19 AM  Result Value Ref Range   WBC 10.1 4.0 - 10.5 K/uL   RBC 3.74 (L) 3.87 - 5.11 MIL/uL   Hemoglobin 10.9 (L) 12.0 - 15.0 g/dL   HCT 37.1 (L) 69.6 - 78.9 %   MCV 88.5 80.0 - 100.0 fL   MCH 29.1 26.0 - 34.0 pg   MCHC 32.9 30.0 - 36.0 g/dL   RDW 38.1 01.7 - 51.0 %   Platelets 232 150 - 400 K/uL   nRBC 0.0 0.0 - 0.2 %  Type and screen   Collection Time: 09/18/19  8:30 AM  Result Value Ref Range   ABO/RH(D) A POS    Antibody Screen NEG    Sample Expiration      09/21/2019,2359 Performed at [facility], [address] [place], [place] [address]     Patient Active Problem List   Diagnosis Date Noted  . Chronic benign essential hypertension, antepartum 09/18/2019  . Unwanted fertility 05/14/2019  . Chronic hypertension during pregnancy, antepartum 03/19/2019  . Supervision of other normal pregnancy, antepartum 03/18/2019  . Hypertension 12/12/2016    Assessment/Plan:  Shelley Rojas is a 38 y.o. [id number] at [redacted]w[redacted]d here for IOL 2/2 cHTN.  #Labor: early labor, progressing normally #Pain: Tolerating with labor support, but requesting epidural at this time #FWB: Category 1; EFW: 3176 @ 37 wks #ID:  n/a #MOF: Bottle #MOC: BTL (possible  pp IUD, if unable to get immediate BTL) #Circ:  N/a female  Vanessa Lawton, Medical Student 09/18/2019, 9:08 AM    I confirm that I have verified the information documented in the medical student's note and that I have also personally reperformed the history, physical exam and all medical decision making activities of this service and have verified that all service and findings are accurately documented in this student's note.   Discussed with patient and spouse her desire for BTL, but  that she only wants to wait 2 hours in between delivery and the procedure. Explained that 2 hours may not be a reasonable timeframe (being too short). Discussed another option being PP Liletta IUD insertion.   Raelyn MoraDawson, Kailei Cowens, CNM 09/18/2019 11:04 AM

## 2019-09-18 NOTE — Anesthesia Postprocedure Evaluation (Signed)
Anesthesia Post Note  Patient: Shelley Rojas  Procedure(s) Performed: AN AD [place]     Patient location during evaluation: Mother Baby Anesthesia Type: Epidural Level of consciousness: awake and alert Pain management: pain level controlled Vital Signs Assessment: post-procedure vital signs reviewed and stable Respiratory status: spontaneous breathing, nonlabored ventilation and respiratory function stable Cardiovascular status: stable Postop Assessment: no headache, no backache and epidural receding Anesthetic complications: no    Last Vitals:  Vitals:   09/18/19 1710 09/18/19 1722  BP: (!) 139/96 (!) 152/105  Pulse: 91 94  Resp: 16 18  Temp:    SpO2: 96%     Last Pain:  Vitals:   09/18/19 1630  TempSrc:   PainSc: 6                  Catheryne Deford

## 2019-09-18 NOTE — Progress Notes (Signed)
Subjective: Shelley Rojas is a 38 y.o. [phone] at [redacted]w[redacted]d by ultrasound admitted for induction of labor due to Chronic Hypertension.  Objective: BP (!) 139/91   Pulse 64   Temp 97.7 F (36.5 C) (Oral)   Resp 17   LMP 12/26/2018 (Exact Date) Comment: negative beta HCG 06/10/18  SpO2 98%  No intake/output data recorded. Total I/O In: -  Out: 1000 [Urine:1000]  FHT:  FHR: 130 bpm, variability: moderate,  accelerations:  Present,  decelerations:  Absent UC:   regular, every 2 minutes SVE:   Dilation: 5.5 Effacement (%): 70 Station: -2 Exam by: Roselle Locus, RN  Labs: Lab Results  Component Value Date   WBC 10.1 09/18/2019   HGB 10.9 (L) 09/18/2019   HCT 33.1 (L) 09/18/2019   MCV 88.5 09/18/2019   PLT 232 09/18/2019    Assessment / Plan: Induction of labor due to cHTN,  progressing well on pitocin  Labor: Progressing on Pitocin, will continue to increase then AROM Preeclampsia:  labs stable Fetal Wellbeing:  Category I Pain Control:  Epidural I/D:  n/a Anticipated MOD:  NSVD  Laury Deep, MSN, CNM 09/18/2019, 12:15 PM

## 2019-09-18 NOTE — Progress Notes (Signed)
Subjective: Shelley Rojas is a 38 y.o. [phone] at [redacted]w[redacted]d by ultrasound admitted for induction of labor due to Chronic Hypertension. Patient comfortable with epidural. Request for AROM.  Objective: BP (!) 139/91   Pulse 64   Temp 98.2 F (36.8 C) (Axillary)   Resp 18   LMP 12/26/2018 (Exact Date) Comment: negative beta HCG 06/10/18  SpO2 98%  No intake/output data recorded. Total I/O In: -  Out: 1000 [Urine:1000]  FHT:  FHR: 120 bpm, variability: moderate,  accelerations:  Present,  decelerations:  Absent UC:   regular, every 2-4 minutes SVE:   Dilation: 5.5 Effacement (%): 70 Station: -2 Exam by:: r Theodoro Koval cnm AROM with copious amounts of clear fluid in return, pt tolerated procedure well  Labs: Lab Results  Component Value Date   WBC 10.1 09/18/2019   HGB 10.9 (L) 09/18/2019   HCT 33.1 (L) 09/18/2019   MCV 88.5 09/18/2019   PLT 232 09/18/2019    Assessment / Plan: Induction of labor due to cHTN,  progressing well on pitocin  Labor: Progressing on Pitocin Preeclampsia:  labs stable Fetal Wellbeing:  Category I Pain Control:  Epidural I/D:  n/a Anticipated MOD:  NSVD  Laury Deep, MSN, CNM 09/18/2019, 4:12 PM

## 2019-09-18 NOTE — Anesthesia Procedure Notes (Signed)
Epidural Patient location during procedure: OB Start time: 09/18/2019 11:45 AM End time: 09/18/2019 11:45 AM  Staffing Anesthesiologist: Janeece Riggers, MD  Preanesthetic Checklist Completed: patient identified, site marked, surgical consent, pre-op evaluation, timeout performed, IV checked, risks and benefits discussed and monitors and equipment checked  Epidural Patient position: sitting Prep: site prepped and draped and DuraPrep Patient monitoring: continuous pulse ox and blood pressure Approach: midline Location: L3-L4 Injection technique: LOR air  Needle:  Needle type: Tuohy  Needle gauge: 17 G Needle length: 9 cm and 9 Needle insertion depth: 5 cm cm Catheter type: closed end flexible Catheter size: 19 Gauge Catheter at skin depth: 10 cm Test dose: negative  Assessment Events: blood not aspirated, injection not painful, no injection resistance, negative IV test and no paresthesia

## 2019-09-19 MED ORDER — AMLODIPINE BESYLATE 5 MG PO TABS
5.0000 mg | ORAL_TABLET | Freq: Every day | ORAL | Status: DC
  Administered 2019-09-19 – 2019-09-20 (×2): 5 mg via ORAL
  Filled 2019-09-19 (×2): qty 1

## 2019-09-19 MED ORDER — OXYCODONE HCL 5 MG PO TABS
5.0000 mg | ORAL_TABLET | Freq: Four times a day (QID) | ORAL | Status: DC | PRN
  Administered 2019-09-19: 10 mg via ORAL
  Administered 2019-09-19: 5 mg via ORAL
  Administered 2019-09-19: 10 mg via ORAL
  Filled 2019-09-19: qty 2
  Filled 2019-09-19: qty 1
  Filled 2019-09-19: qty 2

## 2019-09-19 NOTE — Progress Notes (Addendum)
POSTPARTUM PROGRESS NOTE  POD #01  Subjective:  Shelley Rojas is a 38 y.o. [id number] s/p SVD at [redacted]w[redacted]d.  She reports she doing well. No acute events overnight. She reports she is doing well w/out problems ambulating, voiding or po intake. Denies nausea or vomiting. She has passed flatus. Pain is not controlled, patient states PRN pain meds have not helped lower back, abdominal and pelvic pain. Patient believes a Careers information officer working with Anesthesia dept "messed up" her epidural. Minimal bleeding, no dysuria, no chest pain, troubles breathing, not seeing spots, slight HA, no changes in visual acuity.  Objective: Vitals:   09/18/19 2005 09/18/19 2105 09/19/19 0120 09/19/19 0519  BP: (!) 136/92 (!) 132/95 (!) 133/91 (!) 131/91  Pulse: 72 67 74 65  Resp: 18 18 18 17   Temp: 98.4 F (36.9 C) 98.4 F (36.9 C) 98.4 F (36.9 C) 98.1 F (36.7 C)  TempSrc: Oral Oral Oral Oral  SpO2: 100% 100% 99% 98%     Physical Exam:  General: alert, cooperative and no distress Chest: no respiratory distress or pleuritic pain Heart:regular rate, distal pulses intact Abdomen: soft, nontender,  Uterine Fundus: firm, appropriately tender DVT Evaluation: No calf swelling or tenderness Extremities: mild edema Skin: warm, dry; no rashes or ecchymosis   Recent Labs    09/18/19 0819 09/18/19 1824  HGB 10.9* 9.9*  HCT 33.1* 30.8*    Assessment/Plan: Shelley Rojas is a 38 y.o. [id number] s/p SVD at [redacted]w[redacted]d who was admitted for IOL for cHTN. Post partum assessment showed no visualized lacerations in vaginal, labial or perineal region, prior to post partum implantation of liletta IUD.   Post partum planning and education -Contraception: IUD Engineer, maintenance) -Feeding: Bottle -Follow up scheduled -Educated on abstaining from vaginal intercourse/pulling IUD strings prior to f/u (4-6wks)  Paincontrol revision- Pt noted strong breakthrough pain score 6-8/10, in lower back, abdomen and pelvic region.   -Routine postpartum care -Start oxyCODONE tablet PO 5-10 mg q6hrs PRN breakthrough pain. -Cont Acetaminophen tablet 650mg  PO PRN pain <4 pain scale -Cont Ibuprofen tablet 600mg  PO q6hrs PO   Chronic HTN: Pt did not take medication for HTN prior to or during pregnancy. BP is mildly elevated throughout MB stay. -Start Amlodipine tablet 5mg  PO qd  Dispo: Plan for discharge 24-48hrs.   LOS: 1 day   Sherrian Divers MS 3 09/19/2019, 9:30 AM   I confirm that I have verified the information documented in the medical student's note and that I have also personally reperformed the history, physical exam and all medical decision making activities of this service and have verified that all service and findings are accurately documented in this student's note.    Marcille Buffy DNP, CNM  09/19/19  11:27 AM

## 2019-09-19 NOTE — Addendum Note (Signed)
Addendum  created 09/19/19 0731 by Rayvon Char, CRNA   Charge Capture section accepted, Clinical Note Signed

## 2019-09-19 NOTE — Anesthesia Postprocedure Evaluation (Signed)
Anesthesia Post Note  Patient: Esme Freund Artus  Procedure(s) Performed: AN [facility] [place]     Patient location during evaluation: Mother Baby Anesthesia Type: Epidural Level of consciousness: awake and alert Pain management: pain level controlled Vital Signs Assessment: post-procedure vital signs reviewed and stable Respiratory status: spontaneous breathing, nonlabored ventilation and respiratory function stable Cardiovascular status: stable Postop Assessment: no headache, no backache and epidural receding Anesthetic complications: no    Last Vitals:  Vitals:   09/19/19 0120 09/19/19 0519  BP: (!) 133/91 (!) 131/91  Pulse: 74 65  Resp: 18 17  Temp: 36.9 C 36.7 C  SpO2: 99% 98%    Last Pain:  Vitals:   09/19/19 0519  TempSrc: Oral  PainSc:    Pain Goal:                   Rayvon Char

## 2019-09-20 MED ORDER — AMLODIPINE BESYLATE 10 MG PO TABS: 10.0000 mg | ORAL_TABLET | Freq: Every day | ORAL | 1 refills | Status: DC

## 2019-09-20 MED ORDER — IBUPROFEN 800 MG PO TABS: 800.0000 mg | ORAL_TABLET | Freq: Three times a day (TID) | ORAL | 0 refills | Status: AC | PRN

## 2019-09-23 ENCOUNTER — Telehealth (INDEPENDENT_AMBULATORY_CARE_PROVIDER_SITE_OTHER): Payer: Medicaid Other | Admitting: *Deleted

## 2019-09-23 VITALS — BP 123/96 | HR 79

## 2019-09-23 DIAGNOSIS — O165 Unspecified maternal hypertension, complicating the puerperium: Secondary | ICD-10-CM

## 2019-09-23 DIAGNOSIS — Z0130 Encounter for examination of blood pressure without abnormal findings: Secondary | ICD-10-CM

## 2019-09-23 MED ORDER — PRENATAL 27-0.8 MG PO TABS: 1.0000 | ORAL_TABLET | Freq: Every day | ORAL | 9 refills | Status: DC

## 2019-09-23 NOTE — Progress Notes (Signed)
    Subjective:  Shelley Rojas is a 38 y.o. female s/p SVD telephone visit for BP check.   Hypertension ROS: taking medications as instructed, no medication side effects noted, no TIA's, no chest pain on exertion, no dyspnea on exertion, no swelling of ankles, no orthostatic dizziness or lightheadedness, no palpitations and Patient reported loss of balance two days ago, but feels fine today..    Objective:  BP (!) 123/96 (BP Location: Right Arm, Patient Position: Sitting, Cuff Size: Normal)   Pulse 79   LMP 12/26/2018 (Exact Date) Comment: negative beta HCG 06/10/18  Appearance Non face to face interview. Telephone call. General exam BP noted to be well controlled today in office.    Assessment:   Blood Pressure stable and asymptomatic.   Plan:  Advised patient to call the office or go to [facility] if she develop headache not relieved by Ibuprofen or Tylenol, SOB,dizziness, visual changes or severe swelling of extremeties.   Pt to continue medication regime as prescribed.  Refill for PNV sent to pharmacy. Advised to check blood pressure daily.  Derl Barrow, RN

## 2019-10-12 ENCOUNTER — Encounter: Payer: Self-pay | Admitting: Advanced Practice Midwife

## 2019-10-12 DIAGNOSIS — Z975 Presence of (intrauterine) contraceptive device: Secondary | ICD-10-CM | POA: Insufficient documentation

## 2019-10-22 ENCOUNTER — Other Ambulatory Visit: Payer: Self-pay

## 2019-10-22 ENCOUNTER — Ambulatory Visit (INDEPENDENT_AMBULATORY_CARE_PROVIDER_SITE_OTHER): Payer: Medicaid Other | Admitting: Obstetrics and Gynecology

## 2019-10-22 ENCOUNTER — Encounter: Payer: Self-pay | Admitting: Obstetrics and Gynecology

## 2019-10-22 DIAGNOSIS — Z1389 Encounter for screening for other disorder: Secondary | ICD-10-CM | POA: Diagnosis not present

## 2019-10-22 MED ORDER — HYDROCHLOROTHIAZIDE 25 MG PO TABS: 25.0000 mg | ORAL_TABLET | Freq: Every day | ORAL | 0 refills | Status: DC

## 2019-10-22 NOTE — Progress Notes (Signed)
Subjective:     Shelley Rojas is a 39 y.o. female who presents for a postpartum visit. She is 5 weeks postpartum following a spontaneous vaginal delivery. I have fully reviewed the prenatal and intrapartum course. The delivery was at 26 gestational weeks- IOL for [facility]. Outcome: spontaneous vaginal delivery. Anesthesia: epidural. Postpartum course has been uncomplicated. Baby's course has been uncomplicated. Baby is feeding by bottle - [employer]. Bleeding staining only. Bowel function is normal. Bladder function is normal. Patient is not sexually active. Contraception method is IUD. Postpartum depression screening: negative (score:1)     Review of Systems Pertinent items are noted in HPI.   Objective:    BP 119/81   Pulse 69   Wt 173 lb 4.8 oz (78.6 kg)   LMP 12/26/2018 (Exact Date) Comment: negative beta HCG 06/10/18  Breastfeeding No   BMI 26.35 kg/m   General:  alert, cooperative and no distress   Breasts:  inspection negative, no nipple discharge or bleeding, no masses or nodularity palpable  Lungs: clear to auscultation bilaterally  Heart:  regular rate and rhythm  Abdomen: soft, non-tender; bowel sounds normal; no masses,  no organomegaly   Vulva:  normal  Vagina: normal vagina, no discharge, exudate, lesion, or erythema  Cervix:  multiparous appearance and IUD strings visualiazed extending out of the introitus and were trimmed to a length of 3cm  Corpus: normal size, contour, position, consistency, mobility, non-tender  Adnexa:  normal adnexa and no mass, fullness, tenderness  Rectal Exam: Not performed.        Assessment:     Normal postpartum exam. Pap smear not done at today's visit.   Plan:    1. Contraception: IUD 2. Patient is medically cleared to resume all activities of daily living 3. Follow up in: 6 months for annual exam or as needed.

## 2019-10-25 IMAGING — US US MFM FETAL BPP W/ NON-STRESS
1 series · 15 of 25 positions shown · non-contrast
Comparison: none

[Series 1: us mfm fetal bpp w/ non-stress · 25 acquisitions, 15 frames shown]
[im 1/25]
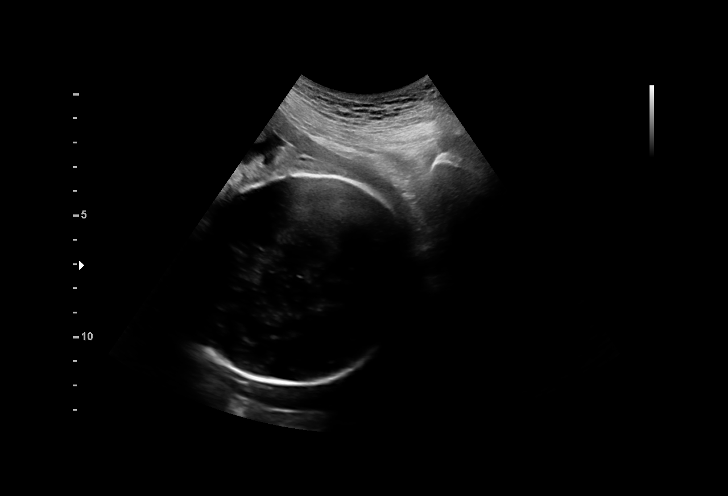
[im 3/25]
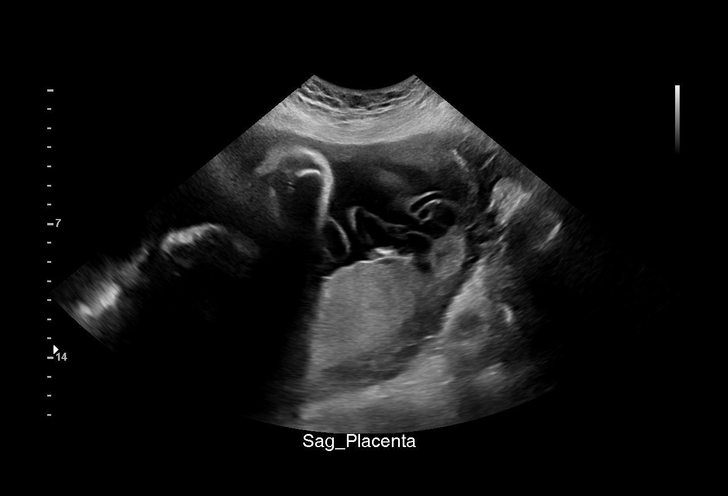
[im 5/25]
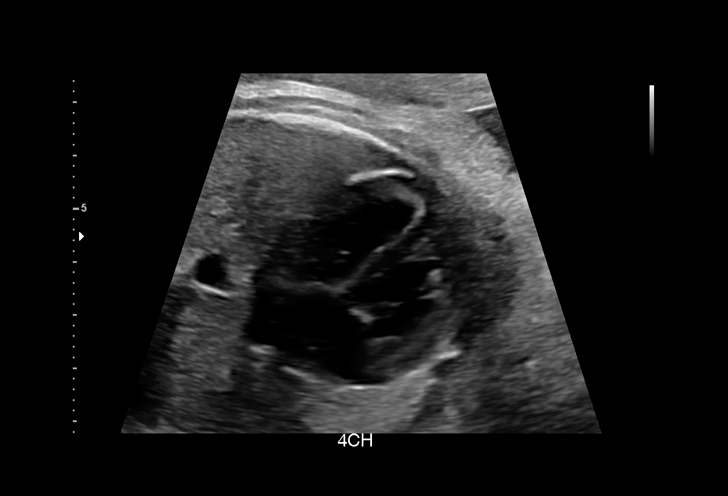
[im 6/25]
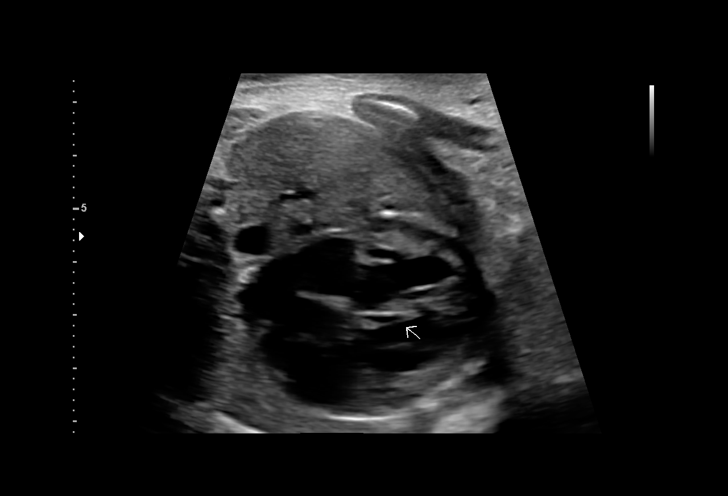
[im 8/25]
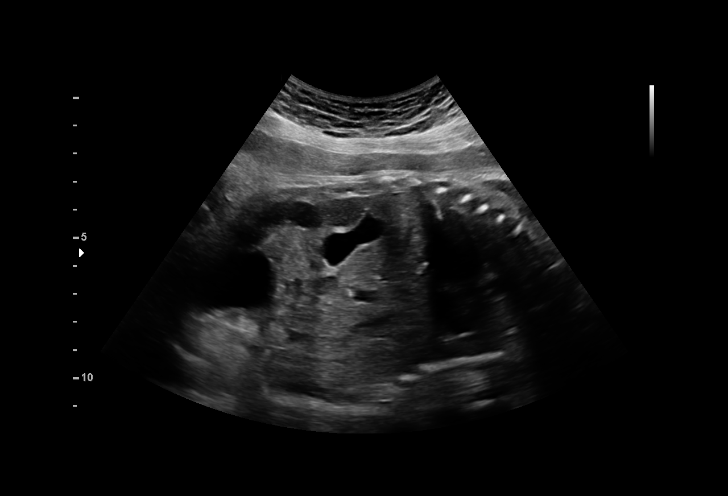
[im 10/25]
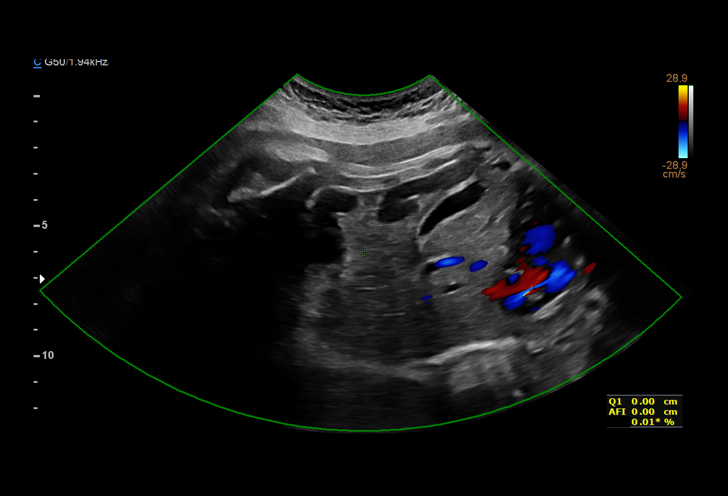
[im 11/25]
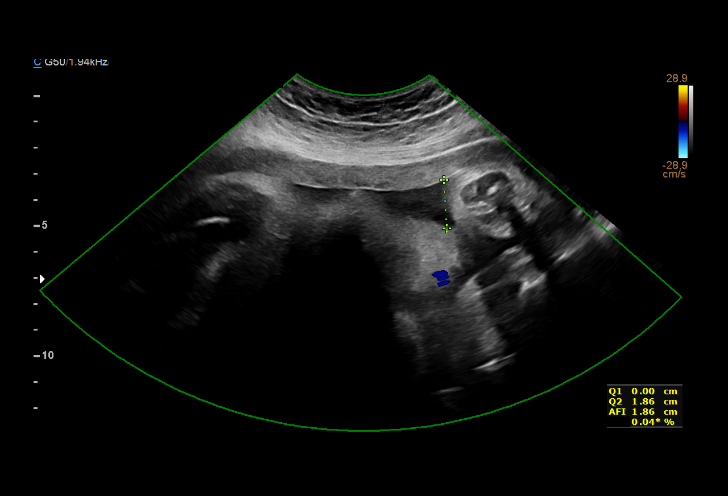
[im 13/25]
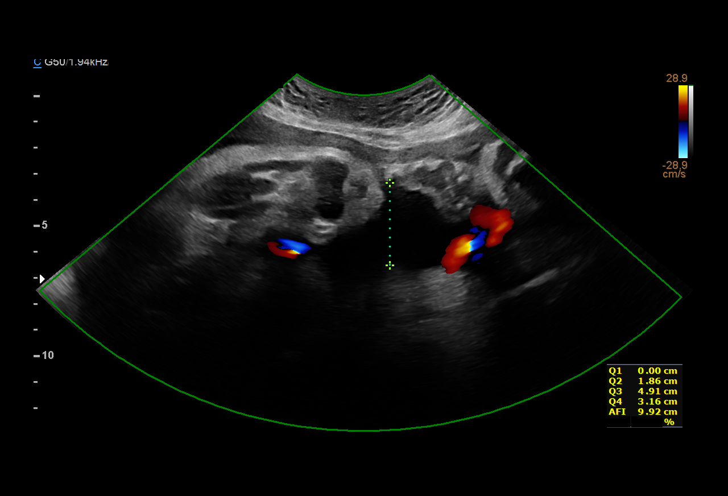
[im 15/25]
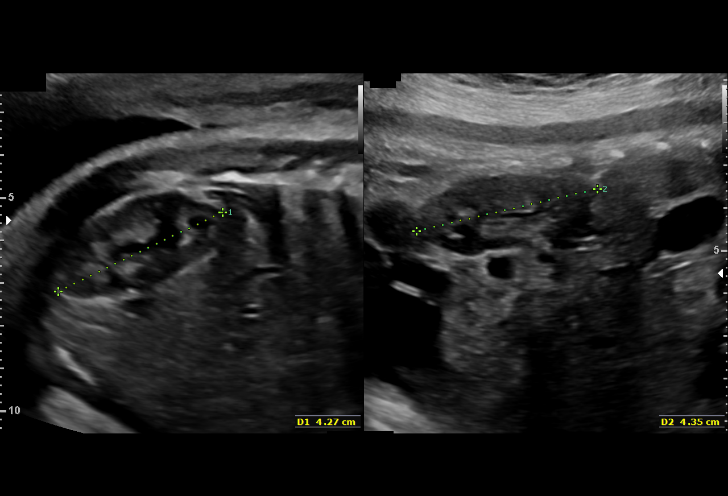
[im 16/25]
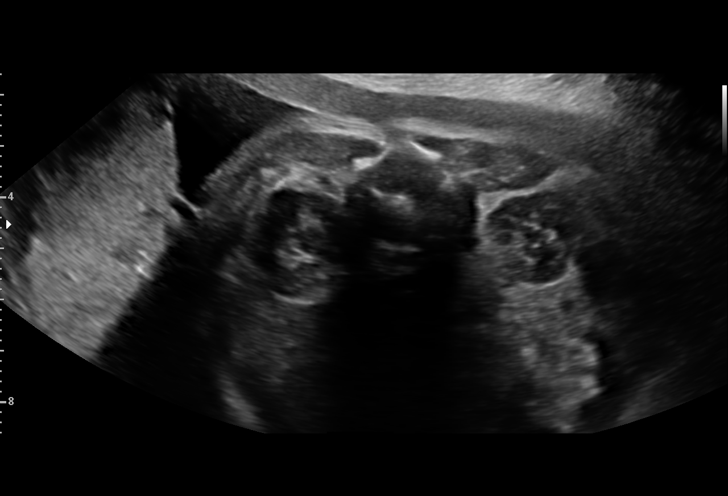
[im 18/25]
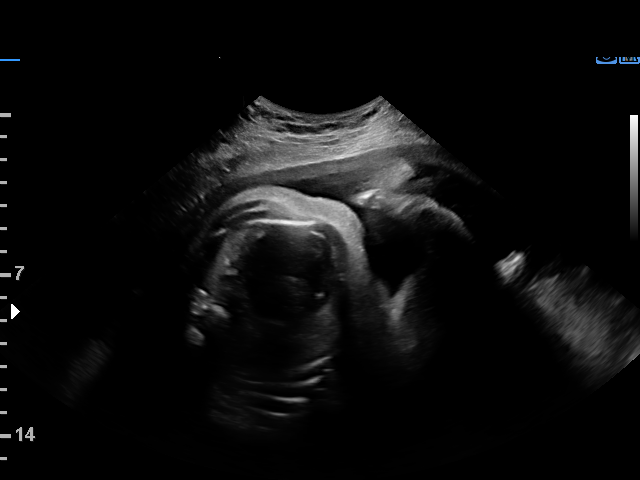
[im 20/25]
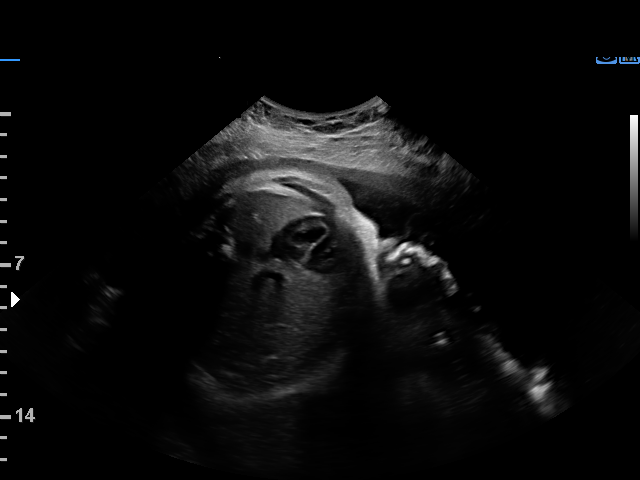
[im 21/25]
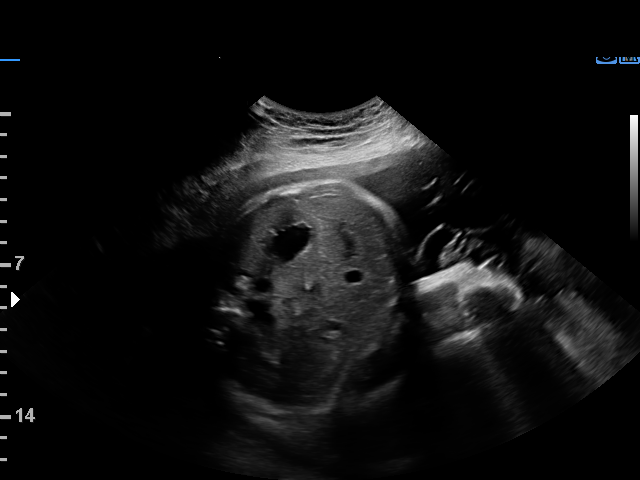
[im 23/25]
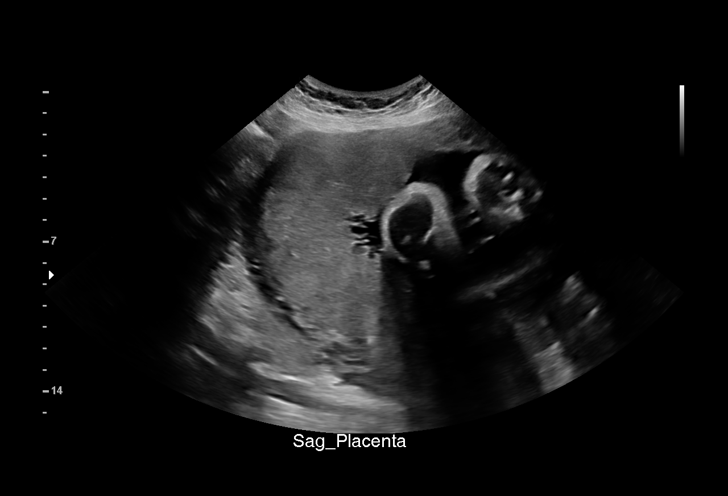
[im 25/25]
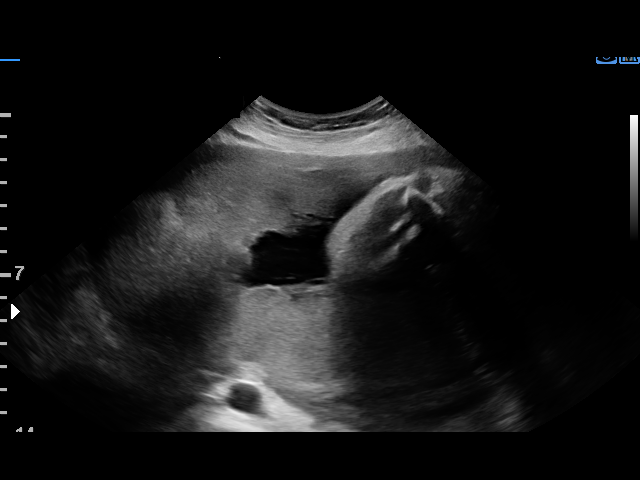

[15 of 25 positions shown; findings below may reference images not displayed]

W/NONSTRESS
 ----------------------------------------------------------------------

 ----------------------------------------------------------------------
Indications

  Hypertension - Chronic/Pre-existing (Low
  dose aspirin)
  Advanced maternal age multigravida 35+,
  third trimester
  Encounter for other antenatal screening
  follow-up
  34 weeks gestation of pregnancy
 ----------------------------------------------------------------------
Vital Signs

 BMI:
Fetal Evaluation

 Num Of Fetuses:         1
 Fetal Heart Rate(bpm):  136
 Cardiac Activity:       Observed
 Presentation:           Cephalic
 Placenta:               Posterior Fundal
 P. Cord Insertion:      Visualized

 Amniotic Fluid
 AFI FV:      Within normal limits

 AFI Sum(cm)     %Tile       Largest Pocket(cm)
 9.93            19

 RUQ(cm)       RLQ(cm)       LUQ(cm)        LLQ(cm)
 0
Biophysical Evaluation

 Amniotic F.V:   Within normal limits       F. Tone:        Observed
 F. Movement:    Not Observed               N.S.T:          Reactive
 F. Breathing:   Not Observed               Score:          [DATE]
OB History

 Gravidity:    4         Term:   3        Prem:   0        SAB:   0
 TOP:          0       Ectopic:  0        Living: 3
Gestational Age

 LMP:           34w 0d        Date:  12/26/18                 EDD:   10/02/19
 Best:          34w 0d     Det. By:  LMP  (12/26/18)          EDD:   10/02/19
Anatomy

 Thoracic:              Appears normal         Abdomen:                Appears normal
 Heart:                 Appears normal         Kidneys:                Appear normal
                        (4CH, axis, and
                        situs)
 Diaphragm:             Appears normal         Bladder:                Appears normal
 Stomach:               Appears normal, left
                        sided
Impression

 Chronic hypertension. Patient is not taking antihypertensives.
 BP at our office today 135/92 mm Hg.

 On ultrasound, amniotic fluid is normal, Fetal movements and
 fetal breathing movements did not meet the criteria. NST is
 reactive. BPP [DATE].
 I explained the significance of BPP scores and recommended
 repeating BPP later this afternoon.
Recommendations

 -Patient will return for BPP today.
                      Rabe, Bhavika

## 2019-10-25 IMAGING — US US MFM FETAL BPP W/O NON-STRESS
1 series · 15 of 28 positions shown · non-contrast
Comparison: none

[Series 1: us mfm fetal bpp w/o non-stress · 29 acquisitions, 15 frames shown]
[im 1/29]
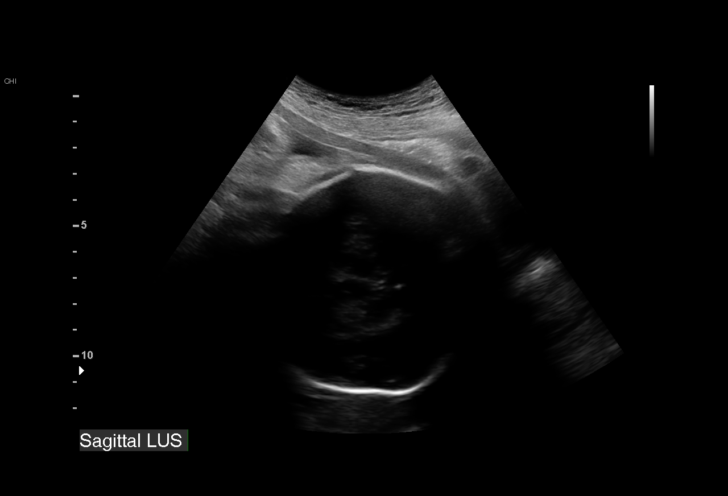
[im 3/29]
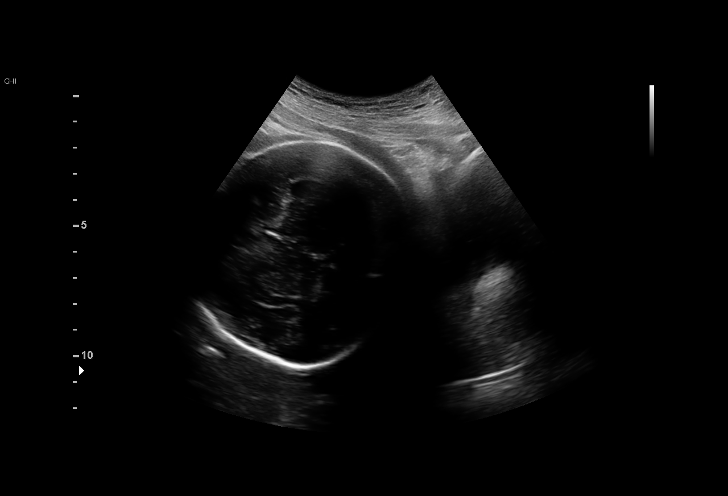
[im 5/29]
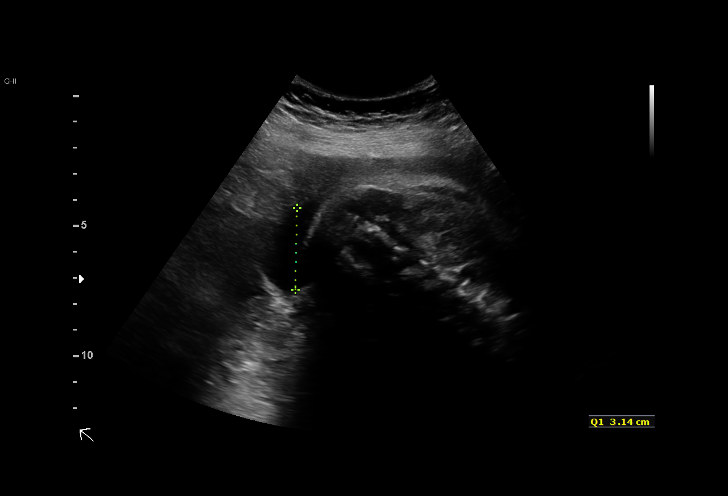
[im 7/29]
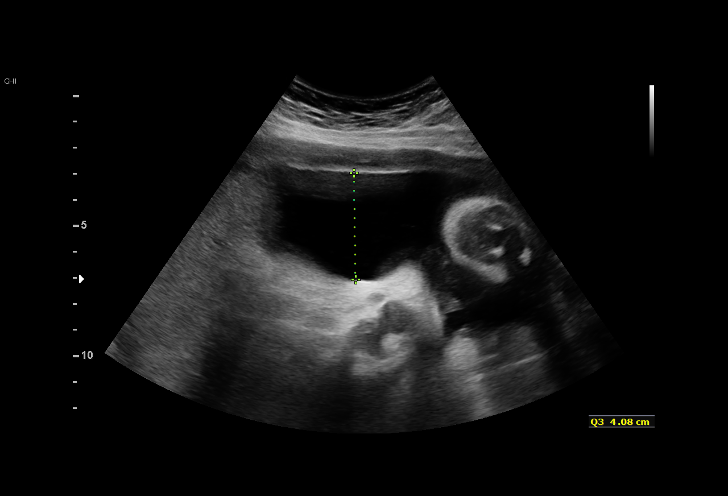
[im 9/29]
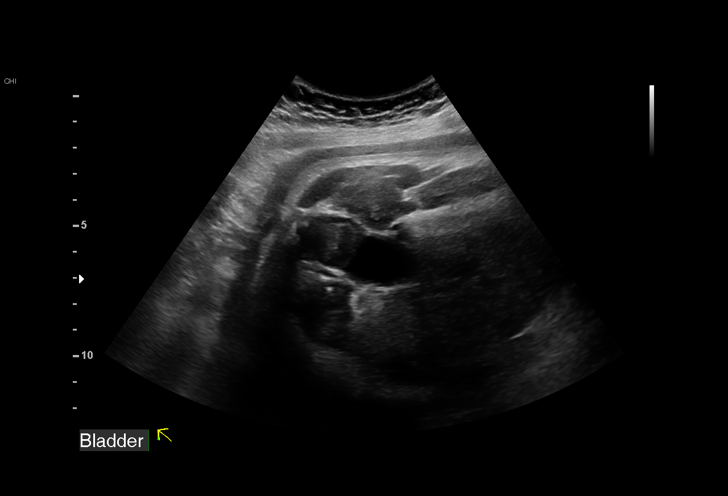
[im 11/29]
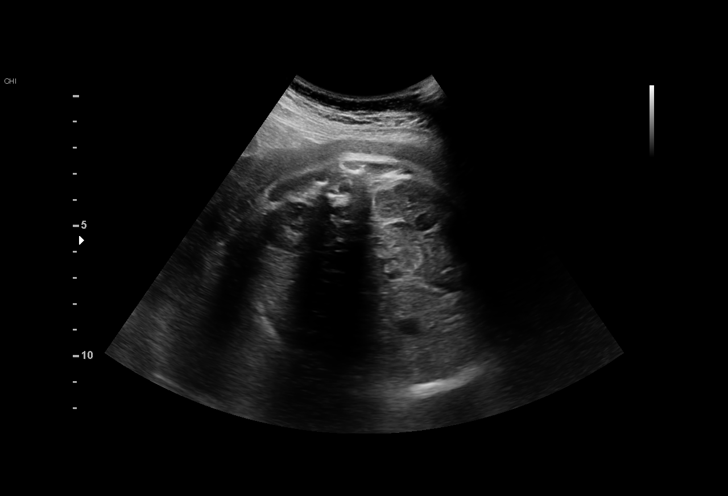
[im 13/29]
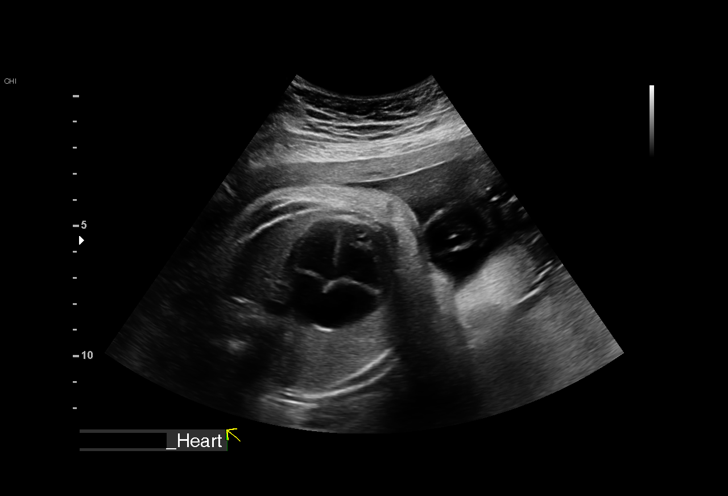
[im 15/29]
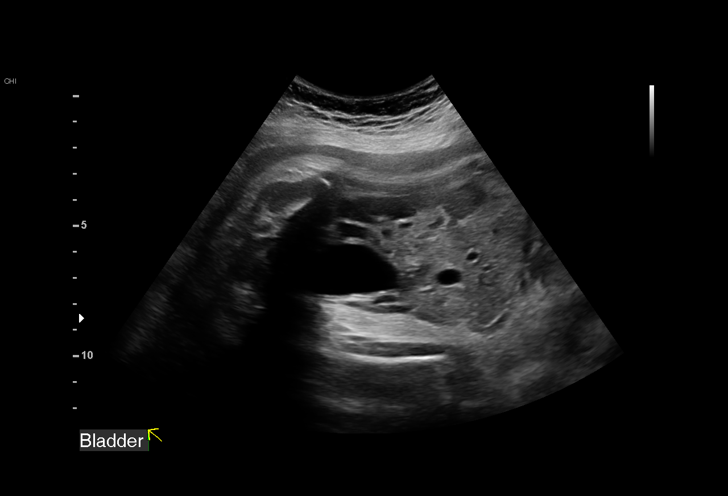
[im 16/29]
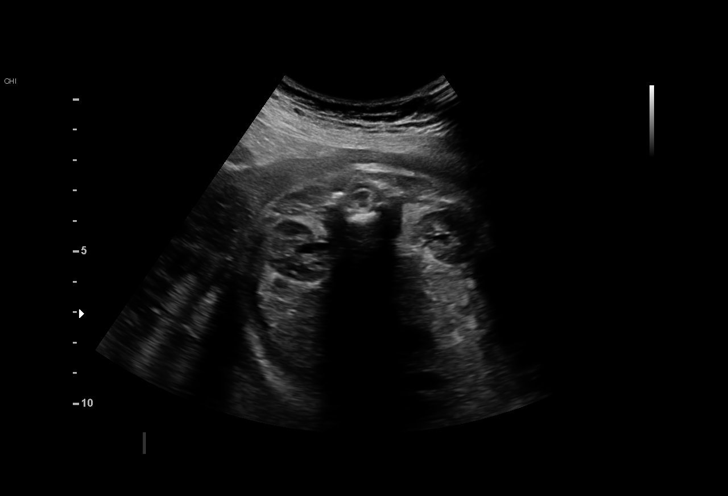
[im 18/29]
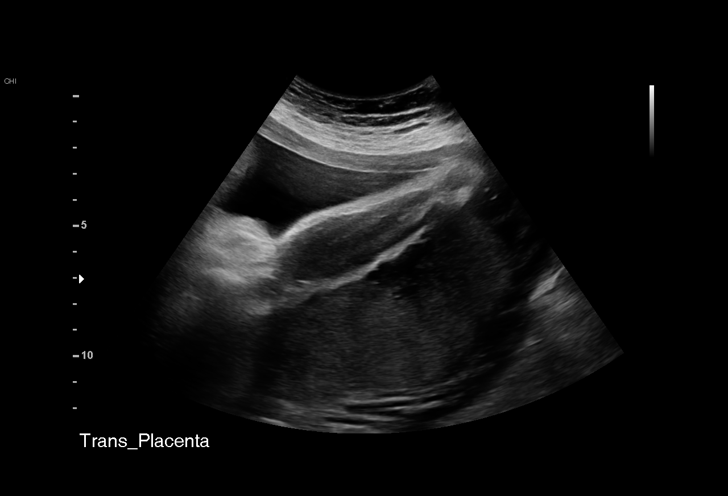
[im 20/29]
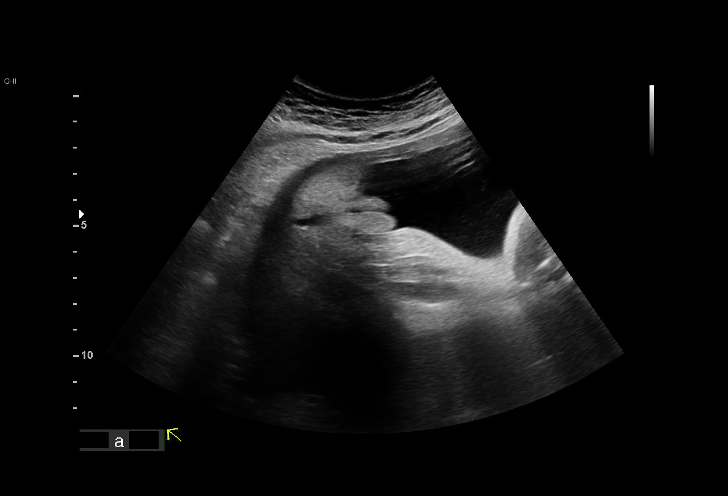
[im 22/29]
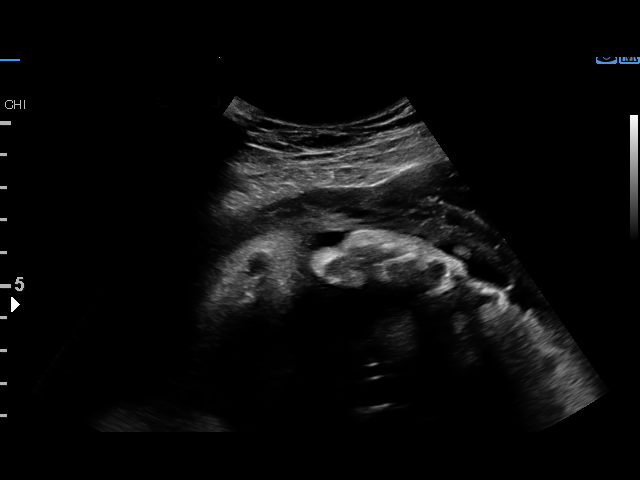
[im 24/29]
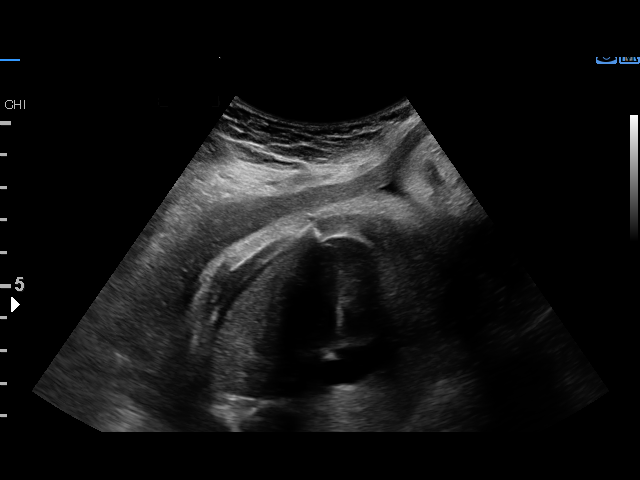
[im 26/29]
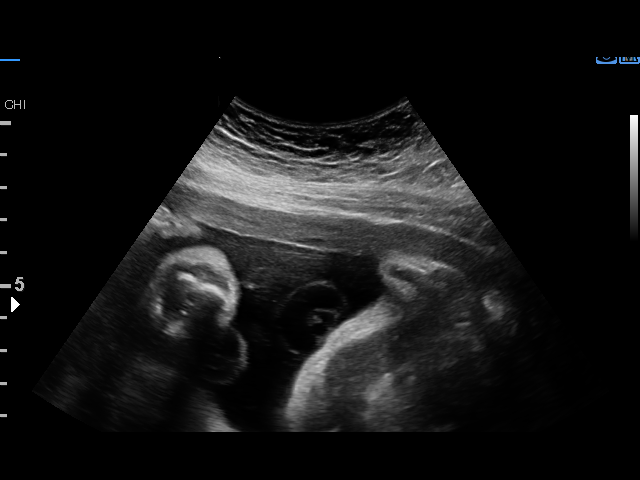
[im 29/29]
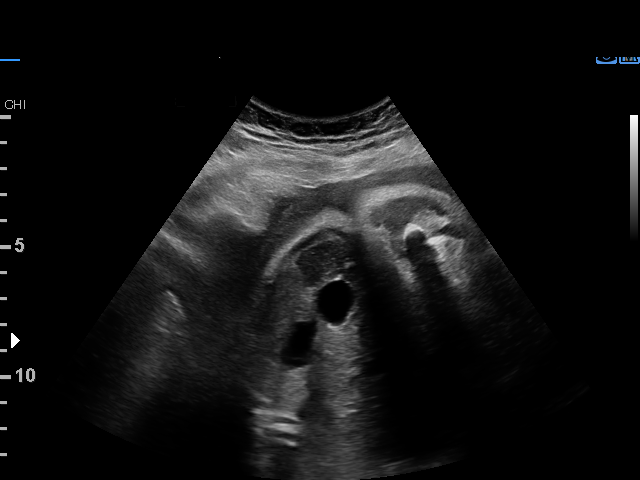

[15 of 28 positions shown; findings below may reference images not displayed]

----------------------------------------------------------------------

 ----------------------------------------------------------------------
Indications

  34 weeks gestation of pregnancy
  Hypertension - Chronic/Pre-existing (Low
  dose aspirin)
  Advanced maternal age multigravida 35+,
  third trimester
  Encounter for other antenatal screening
  follow-up
  34 weeks gestation of pregnancy
 ----------------------------------------------------------------------
Vital Signs

                                                Height:        5'8"
Fetal Evaluation

 Num Of Fetuses:         1
 Fetal Heart Rate(bpm):  137
 Cardiac Activity:       Observed
 Placenta:               Posterior Fundal
 P. Cord Insertion:      Previously Visualized

 Amniotic Fluid
 AFI FV:      Within normal limits

 AFI Sum(cm)     %Tile       Largest Pocket(cm)
 13.42           44

 RUQ(cm)       RLQ(cm)       LUQ(cm)        LLQ(cm)

Biophysical Evaluation

 Amniotic F.V:   Pocket => 2 cm             F. Tone:        Observed
 F. Movement:    Observed                   Score:          [DATE]
 F. Breathing:   Observed
OB History

 Gravidity:    4         Term:   3        Prem:   0        SAB:   0
 TOP:          0       Ectopic:  0        Living: 3
Gestational Age

 LMP:           34w 0d        Date:  12/26/18                 EDD:   10/02/19
 Best:          34w 0d     Det. By:  LMP  (12/26/18)          EDD:   10/02/19
Impression

 Patient return for BPP.  Antenatal performed this morning
 showed BPP score of [DATE].  She reports good fetal
 movements now.
 Amniotic fluid is normal good fetal activity seen.  Antenatal
 testing is reassuring.  BPP [DATE].
 We reassured the patient findings.
Recommendations

 Continue weekly antenatal testing till delivery.
                 Abubakar, Syed Ahmed

## 2019-10-26 ENCOUNTER — Other Ambulatory Visit: Payer: Self-pay | Admitting: Advanced Practice Midwife

## 2019-11-01 IMAGING — US US MFM FETAL BPP W/O NON-STRESS
1 series · 12 of 23 positions shown · non-contrast
Comparison: none

[Series 1: us mfm fetal bpp w/o non-stress · 23 acquisitions, 12 frames shown]
[im 1/23]
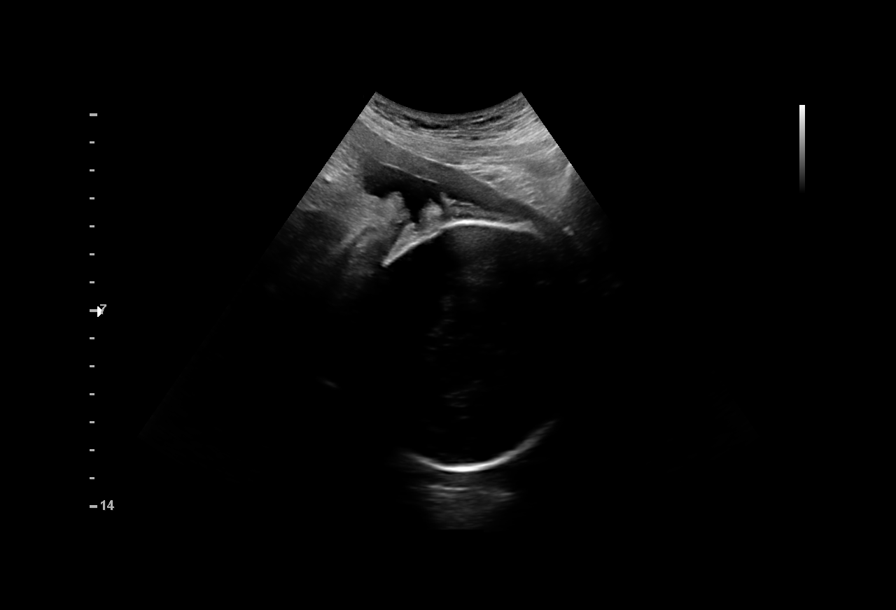
[im 3/23]
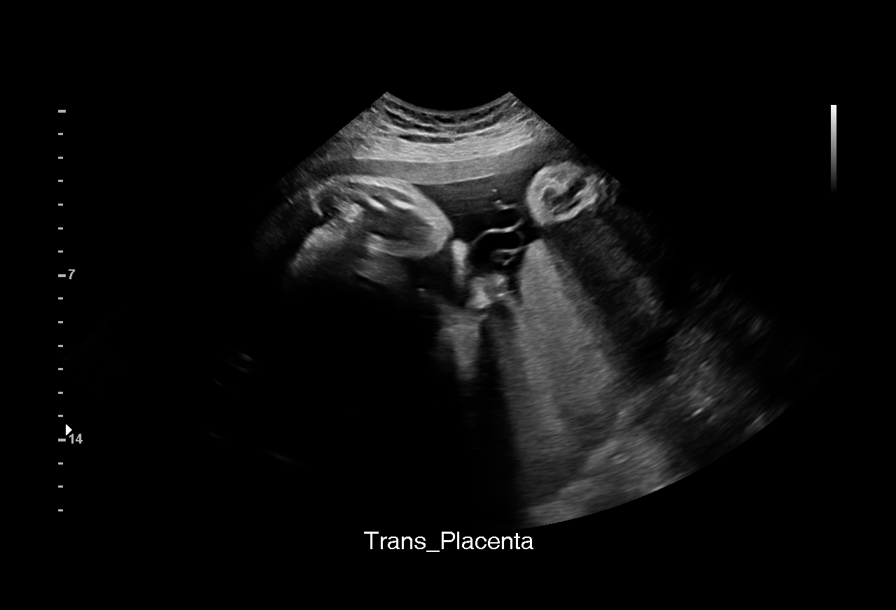
[im 5/23]
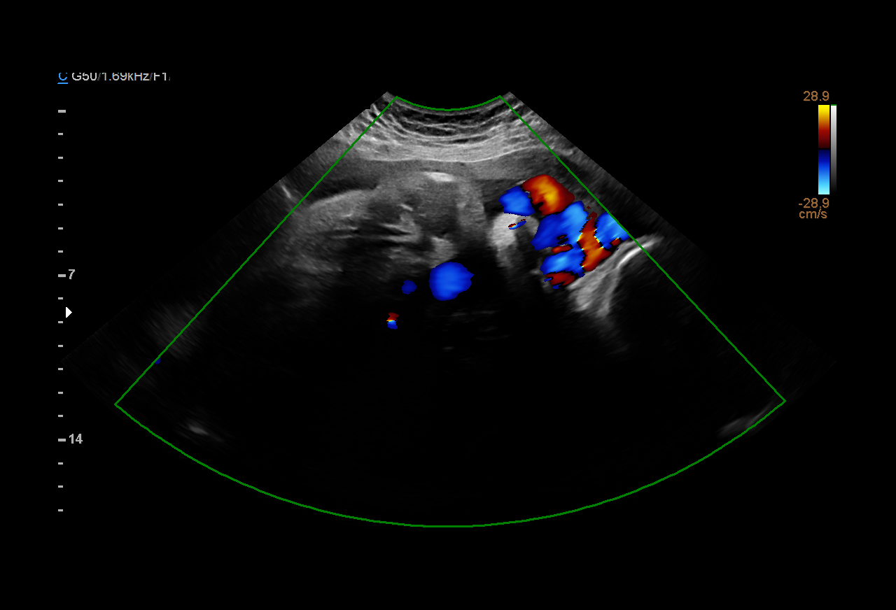
[im 7/23]
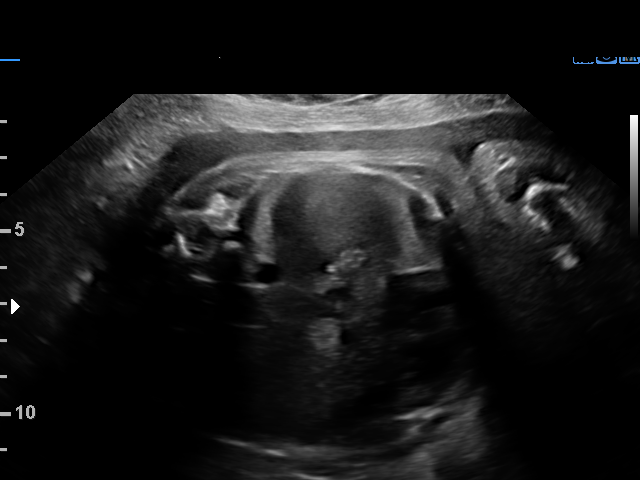
[im 9/23]
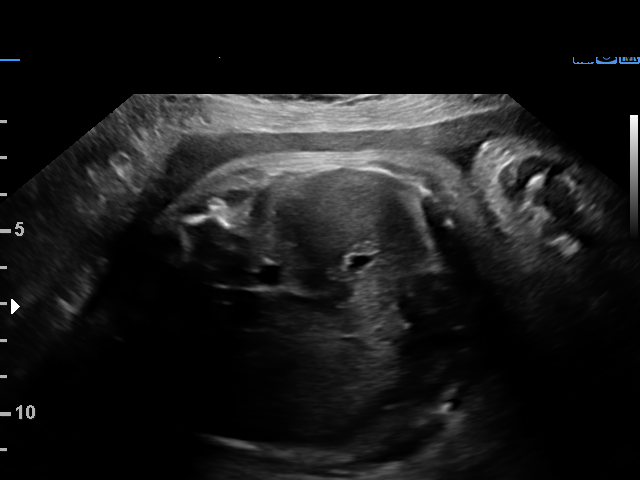
[im 11/23]
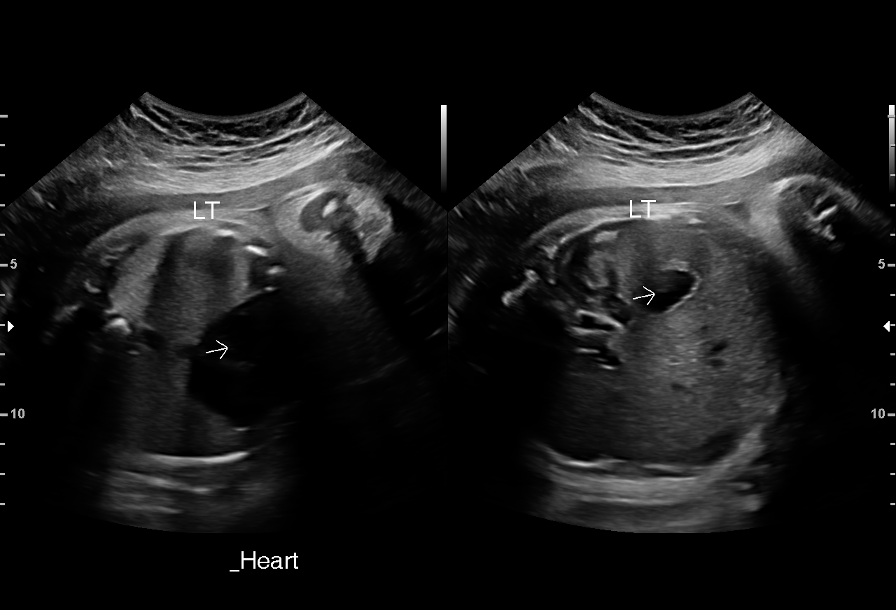
[im 13/23]
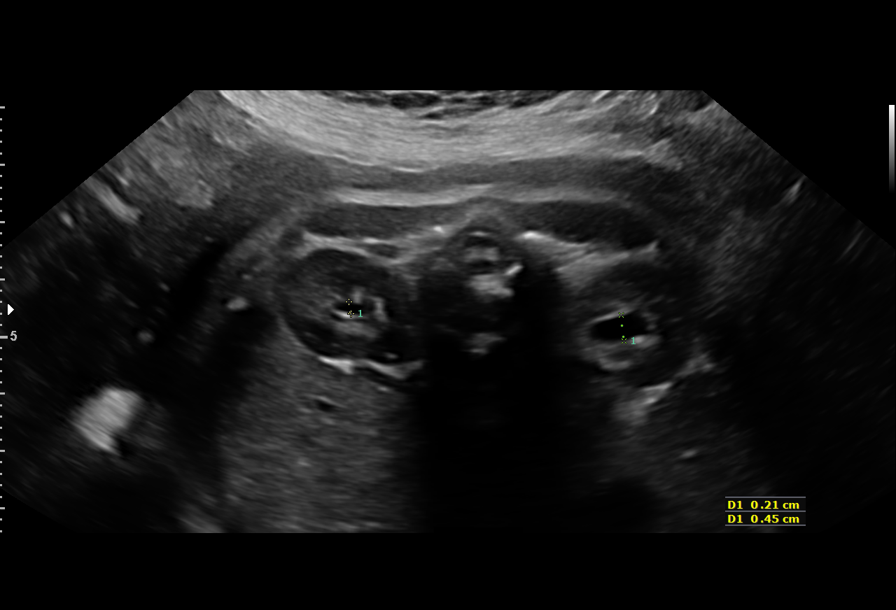
[im 15/23]
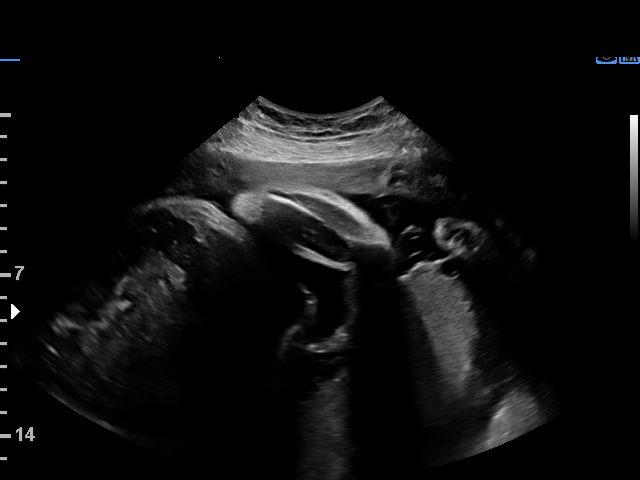
[im 17/23]
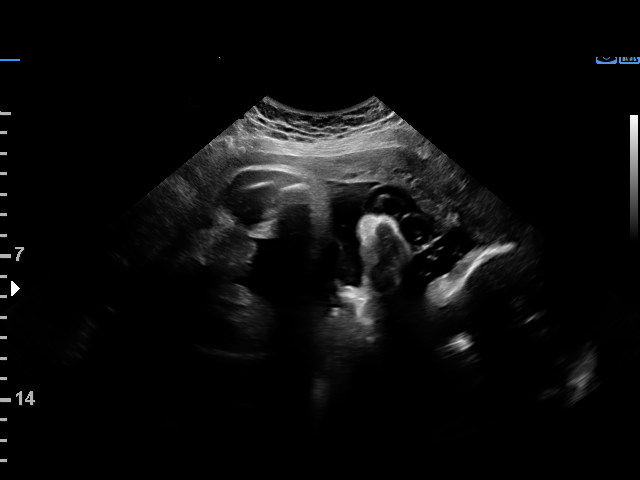
[im 19/23]
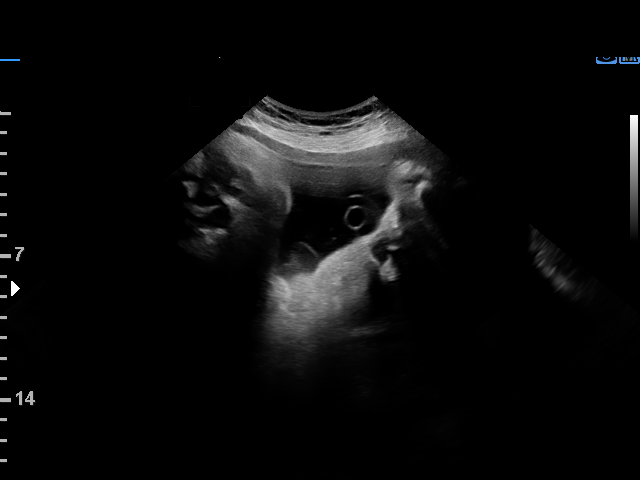
[im 21/23]
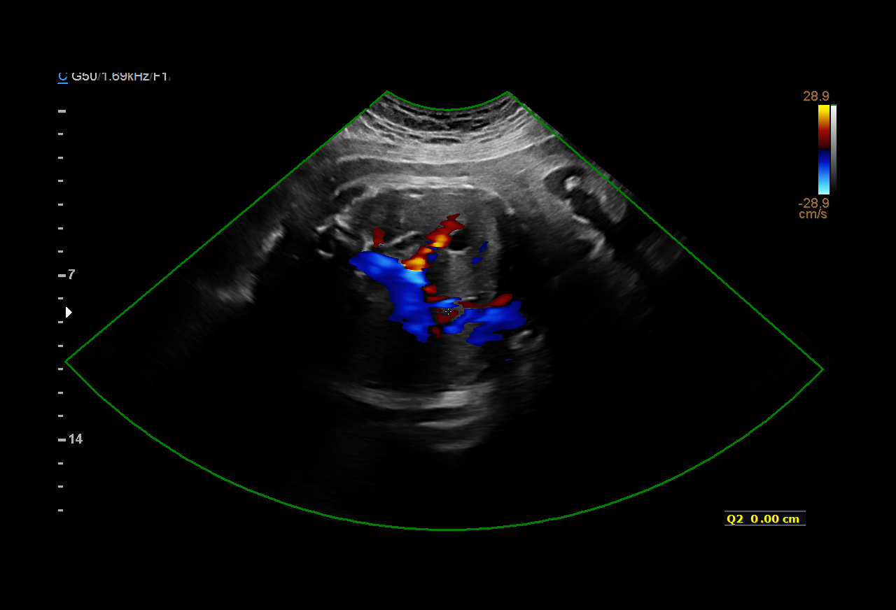
[im 23/23]
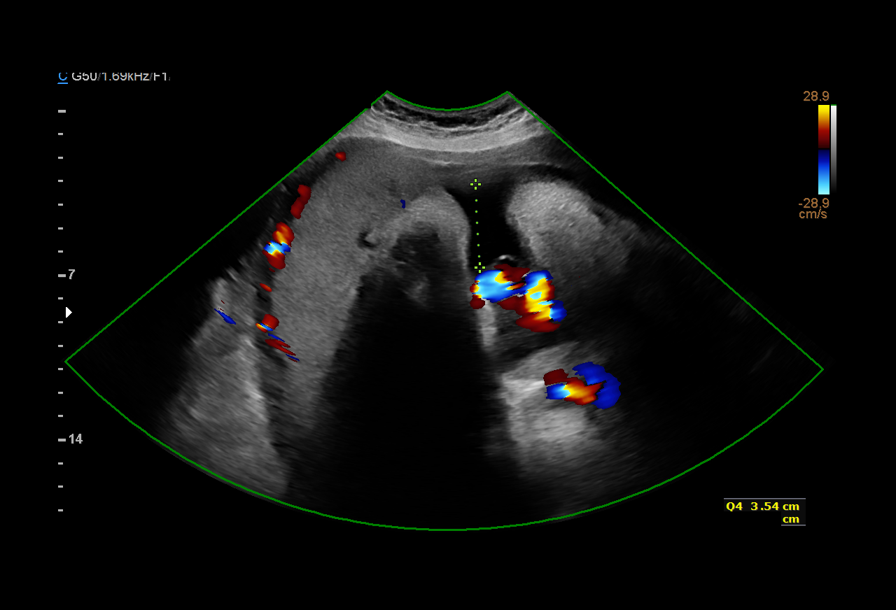

[12 of 23 positions shown; findings below may reference images not displayed]

----------------------------------------------------------------------

 ----------------------------------------------------------------------
Indications

  Hypertension - Chronic/Pre-existing (Low
  dose aspirin)
  Advanced maternal age multigravida 35+,
  third trimester
  Encounter for other antenatal screening
  follow-up
  35 weeks gestation of pregnancy
 ----------------------------------------------------------------------
Vital Signs

 BMI:
Fetal Evaluation

 Num Of Fetuses:         1
 Fetal Heart Rate(bpm):  125
 Cardiac Activity:       Observed
 Presentation:           Cephalic
 Placenta:               Posterior
 P. Cord Insertion:      Previously Visualized

 Amniotic Fluid
 AFI FV:      Within normal limits

 AFI Sum(cm)     %Tile       Largest Pocket(cm)
 8.32            8

 RUQ(cm)       RLQ(cm)       LUQ(cm)        LLQ(cm)
 0             3.54          0
Biophysical Evaluation

 Amniotic F.V:   Within normal limits       F. Tone:        Observed
 F. Movement:    Observed                   Score:          [DATE]
 F. Breathing:   Observed
OB History

 Gravidity:    4         Term:   3        Prem:   0        SAB:   0
 TOP:          0       Ectopic:  0        Living: 3
Gestational Age

 LMP:           35w 0d        Date:  12/26/18                 EDD:   10/02/19
 Best:          35w 0d     Det. By:  LMP  (12/26/18)          EDD:   10/02/19
Comments

  This patient was seen for a biophysical profile due to chronic
 hypertension.
 A biophysical profile performed today was [DATE].  There
 was normal amniotic fluid noted.
 She will return in 1 week for another biophysical profile.

## 2023-06-19 ENCOUNTER — Emergency Department (HOSPITAL_COMMUNITY): Payer: Medicaid Other

## 2023-06-19 ENCOUNTER — Emergency Department (HOSPITAL_COMMUNITY)
Admission: EM | Admit: 2023-06-19 | Discharge: 2023-06-19 | Disposition: A | Discharge status: Home / Self Care | Payer: Medicaid Other | Attending: Emergency Medicine | Admitting: Emergency Medicine

## 2023-06-19 ENCOUNTER — Other Ambulatory Visit: Payer: Self-pay

## 2023-06-19 ENCOUNTER — Encounter (HOSPITAL_COMMUNITY): Payer: Self-pay

## 2023-06-19 DIAGNOSIS — K429 Umbilical hernia without obstruction or gangrene: Secondary | ICD-10-CM | POA: Insufficient documentation

## 2023-06-19 DIAGNOSIS — Z79899 Other long term (current) drug therapy: Secondary | ICD-10-CM | POA: Insufficient documentation

## 2023-06-19 DIAGNOSIS — Z7982 Long term (current) use of aspirin: Secondary | ICD-10-CM | POA: Insufficient documentation

## 2023-06-19 DIAGNOSIS — I10 Essential (primary) hypertension: Secondary | ICD-10-CM | POA: Insufficient documentation

## 2023-06-19 DIAGNOSIS — R109 Unspecified abdominal pain: Secondary | ICD-10-CM | POA: Diagnosis present

## 2023-06-19 LAB — CBC
HCT: 48 % — ABNORMAL HIGH (ref 36.0–46.0)
Hemoglobin: 16.2 g/dL — ABNORMAL HIGH (ref 12.0–15.0)
MCH: 31.6 pg (ref 26.0–34.0)
MCHC: 33.8 g/dL (ref 30.0–36.0)
MCV: 93.8 fL (ref 80.0–100.0)
Platelets: 198 10*3/uL (ref 150–400)
RBC: 5.12 MIL/uL — ABNORMAL HIGH (ref 3.87–5.11)
RDW: 13 % (ref 11.5–15.5)
WBC: 6.7 10*3/uL (ref 4.0–10.5)
nRBC: 0 % (ref 0.0–0.2)

## 2023-06-19 LAB — COMPREHENSIVE METABOLIC PANEL
ALT: 17 U/L (ref 0–44)
AST: 16 U/L (ref 15–41)
Albumin: 4.4 g/dL (ref 3.5–5.0)
Alkaline Phosphatase: 55 U/L (ref 38–126)
Anion gap: 11 (ref 5–15)
BUN: 10 mg/dL (ref 6–20)
CO2: 21 mmol/L — ABNORMAL LOW (ref 22–32)
Calcium: 9.4 mg/dL (ref 8.9–10.3)
Chloride: 103 mmol/L (ref 98–111)
Creatinine, Ser: 0.85 mg/dL (ref 0.44–1.00)
GFR, Estimated: >60 mL/min (ref >60)
Glucose, Bld: 100 mg/dL — ABNORMAL HIGH (ref 70–99)
Potassium: 3.8 mmol/L (ref 3.5–5.1)
Sodium: 135 mmol/L (ref 135–145)
Total Bilirubin: 0.9 mg/dL (ref 0.3–1.2)
Total Protein: 7.8 g/dL (ref 6.5–8.1)

## 2023-06-19 LAB — HCG, SERUM, QUALITATIVE: Preg, Serum: NEGATIVE

## 2023-06-19 LAB — LIPASE, BLOOD: Lipase: 29 U/L (ref 11–51)

## 2023-06-19 MED ORDER — POLYETHYLENE GLYCOL 3350 17 GM/SCOOP PO POWD: 1.0000 | Freq: Three times a day (TID) | ORAL | 0 refills | Status: AC

## 2023-06-19 MED ORDER — LIDOCAINE VISCOUS HCL 2 % MT SOLN: 15.0000 mL | Freq: Once | OROMUCOSAL | Status: DC

## 2023-06-19 MED ORDER — MORPHINE SULFATE (PF) 4 MG/ML IV SOLN
4.0000 mg | Freq: Once | INTRAVENOUS | Status: AC
  Administered 2023-06-19: 4 mg via INTRAVENOUS
  Filled 2023-06-19: qty 1

## 2023-06-19 MED ORDER — ALUM & MAG HYDROXIDE-SIMETH 200-200-20 MG/5ML PO SUSP: 30.0000 mL | Freq: Once | ORAL | Status: DC

## 2023-06-19 MED ORDER — ONDANSETRON HCL 4 MG/2ML IJ SOLN
4.0000 mg | Freq: Once | INTRAMUSCULAR | Status: AC
  Administered 2023-06-19: 4 mg via INTRAVENOUS
  Filled 2023-06-19: qty 2

## 2023-06-19 MED ORDER — IOHEXOL 350 MG/ML SOLN
75.0000 mL | Freq: Once | INTRAVENOUS | Status: AC | PRN
  Administered 2023-06-19: 75 mL via INTRAVENOUS

## 2023-06-19 NOTE — ED Triage Notes (Signed)
Pt c/o mid abdominal pain and N/Vx2d.

## 2023-06-19 NOTE — ED Provider Notes (Signed)
      Physical Exam  BP (!) 146/106   Pulse (!) 56   Temp 98.6 F (37 C) (Oral)   Resp 16   Ht 5\' 8"  (1.727 m)   Wt 78.6 kg   SpO2 99%   BMI 26.35 kg/m   Physical Exam Vitals and nursing note reviewed.  Constitutional:      General: She is not in acute distress. HENT:     Head: Normocephalic and atraumatic.     Nose: Nose normal.  Eyes:     General: No scleral icterus. Cardiovascular:     Rate and Rhythm: Normal rate and regular rhythm.     Pulses: Normal pulses.     Heart sounds: Normal heart sounds.  Pulmonary:     Effort: Pulmonary effort is normal. No respiratory distress.     Breath sounds: No wheezing.  Abdominal:     Palpations: Abdomen is soft.     Tenderness: There is abdominal tenderness.     Comments: Patient with large umbilical hernia with some redness overlying.  Musculoskeletal:     Cervical back: Normal range of motion.     Right lower leg: No edema.     Left lower leg: No edema.  Skin:    General: Skin is warm and dry.     Capillary Refill: Capillary refill takes less than 2 seconds.  Neurological:     Mental Status: She is alert. Mental status is at baseline.  Psychiatric:        Mood and Affect: Mood normal.        Behavior: Behavior normal.     Procedures  Hernia reduction  Date/Time: 06/20/2023 12:44 AM  Performed by: Gailen Shelter, PA Authorized by: Gailen Shelter, PA  Consent: Verbal consent obtained. Risks and benefits: risks, benefits and alternatives were discussed Consent given by: patient Patient understanding: patient states understanding of the procedure being performed Patient consent: the patient's understanding of the procedure matches consent given Relevant documents: relevant documents present and verified Test results: test results available and properly labeled Imaging studies: imaging studies available Patient identity confirmed: verbally with patient and arm band Local anesthesia used:  no  Anesthesia: Local anesthesia used: no  Sedation: Patient sedated: no  Patient tolerance: patient tolerated the procedure well with no immediate complications Comments: Umbilical hernia was reduced at bedside.     ED Course / MDM    Medical Decision Making Amount and/or Complexity of Data Reviewed Labs: ordered. Radiology: ordered.  Risk Prescription drug management.   I evaluated patient at bedside she is already had CT imaging which showed nonischemic umbilical hernia.  I reduces at bedside she had complete resolution of her pain.  Will discharge home with follow-up with general surgery and bowel regimen to help with softening her stool.       Solon Augusta [place], [place] 06/20/23 1610    Loetta Rough, MD 06/20/23 [phone]

## 2023-06-19 NOTE — Discharge Instructions (Addendum)
You have an umbilical hernia which will ultimately need repair outside of the hospital for general surgeon.  I have given you information for a surgeon to follow-up with.  Please tell the instructions have given you about how to reduce the hernia.  If you are not able to reduce it at home you will need to return to the emergency room.  It will almost certainly protrude again especially when you are pooping or if you cough or sneeze.  Try to avoid it lifting heavy objects.  Or straining in any way.  I have also given you instructions on a bowel regimen below.  This will help soften your stool  GETTING TO GOOD BOWEL HEALTH. Irregular bowel habits such as constipation and diarrhea can lead to many problems over time.  Having one soft bowel movement a day is the most important way to prevent further problems.  The anorectal canal is designed to handle stretching and feces to safely manage our ability to get rid of solid waste (feces, poop, stool) out of our body.  BUT, hard constipated stools can act like ripping concrete bricks and diarrhea can be a burning fire to this very sensitive area of our body, causing inflamed hemorrhoids, anal fissures, increasing risk is perirectal abscesses, abdominal pain/bloating, an making irritable bowel worse.     The goal: ONE SOFT BOWEL MOVEMENT A DAY!  To have soft, regular bowel movements:  Drink at least 8 tall glasses of water a day.   Take plenty of fiber.  Fiber is the undigested part of plant food that passes into the colon, acting s "natures broom" to encourage bowel motility and movement.  Fiber can absorb and hold large amounts of water. This results in a larger, bulkier stool, which is soft and easier to pass. Work gradually over several weeks up to 6 servings a day of fiber (25g a day even more if needed) in the form of: Vegetables -- Root (potatoes, carrots, turnips), leafy green (lettuce, salad greens, celery, spinach), or cooked high residue (cabbage,  broccoli, etc) Fruit -- Fresh (unpeeled skin & pulp), Dried (prunes, apricots, cherries, etc ),  or stewed ( applesauce)  Whole grain breads, pasta, etc (whole wheat)  Bran cereals  Bulking Agents -- This type of water-retaining fiber generally is easily obtained each day by one of the following:  Psyllium bran -- The psyllium plant is remarkable because its ground seeds can retain so much water. This product is available as Metamucil, Konsyl, Effersyllium, Per Diem Fiber, or the less expensive generic preparation in drug and health food stores. Although labeled a laxative, it really is not a laxative.  Methylcellulose -- This is another fiber derived from wood which also retains water. It is available as Citrucel. Polyethylene Glycol - and "artificial" fiber commonly called Miralax or Glycolax.  It is helpful for people with gassy or bloated feelings with regular fiber Flax Seed - a less gassy fiber than psyllium No reading or other relaxing activity while on the toilet. If bowel movements take longer than 5 minutes, you are too constipated AVOID CONSTIPATION.  High fiber and water intake usually takes care of this.  Sometimes a laxative is needed to stimulate more frequent bowel movements, but  Laxatives are not a good long-term solution as it can wear the colon out. Osmotics (Milk of Magnesia, Fleets phosphosoda, Magnesium citrate, MiraLax, GoLytely) are safer than  Stimulants (Senokot, Castor Oil, Dulcolax, Ex Lax)    Do not take laxatives for more than  7days in a row.  IF SEVERELY CONSTIPATED, try a Bowel Retraining Program: Do not use laxatives.  Eat a diet high in roughage, such as bran cereals and leafy vegetables.  Drink six (6) ounces of prune or apricot juice each morning.  Eat two (2) large servings of stewed fruit each day.  Take one (1) heaping tablespoon of a psyllium-based bulking agent twice a day. Use sugar-free sweetener when possible to avoid excessive calories.  Eat a normal  breakfast.  Set aside 15 minutes after breakfast to sit on the toilet, but do not strain to have a bowel movement.  If you do not have a bowel movement by the third day, use an enema and repeat the above steps.  Controlling diarrhea Switch to liquids and simpler foods for a few days to avoid stressing your intestines further. Avoid dairy products (especially milk & ice cream) for a short time.  The intestines often can lose the ability to digest lactose when stressed. Avoid foods that cause gassiness or bloating.  Typical foods include beans and other legumes, cabbage, broccoli, and dairy foods.  Every person has some sensitivity to other foods, so listen to our body and avoid those foods that trigger problems for you. Adding fiber (Citrucel, Metamucil, psyllium, Miralax) gradually can help thicken stools by absorbing excess fluid and retrain the intestines to act more normally.  Slowly increase the dose over a few weeks.  Too much fiber too soon can backfire and cause cramping & bloating. Probiotics (such as active yogurt, Align, etc) may help repopulate the intestines and colon with normal bacteria and calm down a sensitive digestive tract.  Most studies show it to be of mild help, though, and such products can be costly. Medicines: Bismuth subsalicylate (ex. Kayopectate, Pepto Bismol) every 30 minutes for up to 6 doses can help control diarrhea.  Avoid if pregnant. Loperamide (Immodium) can slow down diarrhea.  Start with two tablets (4mg  total) first and then try one tablet every 6 hours.  Avoid if you are having fevers or severe pain.  If you are not better or start feeling worse, stop all medicines and call your doctor for advice Call your doctor if you are getting worse or not better.  Sometimes further testing (cultures, endoscopy, X-ray studies, bloodwork, etc) may be needed to help diagnose and treat the cause of the diarrhea.  Managing Pain  Pain after surgery or related to activity is  often due to strain/injury to muscle, tendon, nerves and/or incisions.  This pain is usually short-term and will improve in a few months.   Many people find it helpful to do the following things TOGETHER to help speed the process of healing and to get back to regular activity more quickly:  Avoid heavy physical activity  no lifting greater than 20 pounds Do not "push through" the pain.  Listen to your body and avoid positions and maneuvers than reproduce the pain Walking is okay as tolerated, but go slowly and stop when getting sore.  Remember: If it hurts to do it, then don't do it! Take Anti-inflammatory medication  Take with food/snack around the clock for 1-2 weeks This helps the muscle and nerve tissues become less irritable and calm down faster Choose ONE of the following over-the-counter medications: Naproxen 220mg  tabs (ex. Aleve) 1-2 pills twice a day  Ibuprofen 200mg  tabs (ex. Advil, Motrin) 3-4 pills with every meal and just before bedtime Acetaminophen 500mg  tabs (Tylenol) 1-2 pills with every meal and just before  bedtime Use a Heating pad or Ice/Cold Pack 4-6 times a day May use warm bath/hottub  or showers Try Gentle Massage and/or Stretching  at the area of pain many times a day stop if you feel pain - do not overdo it  Try these steps together to help you body heal faster and avoid making things get worse.  Doing just one of these things may not be enough.    If you are not getting better after two weeks or are noticing you are getting worse, contact our office for further advice; we may need to re-evaluate you & see what other things we can do to help.

## 2023-06-19 NOTE — ED Notes (Signed)
Patient transported to CT 

## 2023-06-19 NOTE — ED Provider Notes (Signed)
[place] EMERGENCY DEPARTMENT AT [facility] Provider Note   CSN: [id number] Arrival date & time: 06/19/23  1159     History  Chief Complaint  Patient presents with   Abdominal Pain    Shelley Rojas is a 42 y.o. female.  With a history of umbilical hernia, hypertension who presents to the ED for evaluation of abdominal pain.  This began as epigastric abdominal pain approximately 2 days ago.  The pain has gotten larger during that time.  Pain now encompasses most of the abdomen.  It is described as a burning and aching pain.  She endorses nausea and 2 episodes of vomiting over the past 2 days.  Denies bilious emesis or hematemesis.  She denies diarrhea or constipation.  Last normal bowel movement was yesterday.  No fevers or chills.  Pain is described as constant.  She denies smoking.  States she discontinued smoking 2 years ago.  Denies other recreational drug use.  No alcohol use recently.  No history of abdominal surgeries.   Abdominal Pain Associated symptoms: nausea and vomiting        Home Medications Prior to Admission medications   Medication Sig Start Date End Date Taking? Authorizing Provider  amLODipine (NORVASC) 10 MG tablet Take 1 tablet (10 mg total) by mouth daily. 09/21/19   Armando Reichert, CNM  aspirin EC 81 MG tablet Take 1 tablet (81 mg total) by mouth daily. Patient not taking: Reported on 09/23/2019 03/19/19   Adam Phenix, MD  Doxylamine Succinate, Sleep, (UNISOM PO) Take by mouth.    [provider]  hydrochlorothiazide (HYDRODIURIL) 25 MG tablet Take 1 tablet (25 mg total) by mouth daily. 10/22/19   Constant, Peggy, MD  ibuprofen (ADVIL) 800 MG tablet Take 1 tablet (800 mg total) by mouth every 8 (eight) hours as needed. 09/20/19   Thressa Sheller D, CNM  Prenatal Vit-Fe Fumarate-FA (MULTIVITAMIN-PRENATAL) 27-0.8 MG TABS tablet Take 1 tablet by mouth daily at 12 noon. 09/23/19   Conan Bowens, MD  Pyridoxine HCl (B-6 PO) Take by  mouth.    [provider]      Allergies    Patient has no known allergies.    Review of Systems   Review of Systems  Gastrointestinal:  Positive for abdominal pain, nausea and vomiting.  All other systems reviewed and are negative.   Physical Exam Updated Vital Signs BP (!) 140/104   Pulse 80   Temp 99 F (37.2 C) (Oral)   Resp 18   Ht 5\' 8"  (1.727 m)   Wt 78.6 kg   SpO2 99%   BMI 26.35 kg/m  Physical Exam Vitals and nursing note reviewed.  Constitutional:      General: She is not in acute distress.    Appearance: She is well-developed.  HENT:     Head: Normocephalic and atraumatic.  Eyes:     Conjunctiva/sclera: Conjunctivae normal.  Cardiovascular:     Rate and Rhythm: Normal rate and regular rhythm.     Heart sounds: No murmur heard. Pulmonary:     Effort: Pulmonary effort is normal. No respiratory distress.     Breath sounds: Normal breath sounds.  Abdominal:     Palpations: Abdomen is soft.     Tenderness: There is abdominal tenderness. There is no right CVA tenderness or left CVA tenderness. Positive signs include McBurney's sign. Negative signs include psoas sign and obturator sign.     Hernia: A hernia is present. Hernia is present  in the umbilical area.     Comments: Negative heeltap test  Musculoskeletal:        General: No swelling.     Cervical back: Neck supple.  Skin:    General: Skin is warm and dry.     Capillary Refill: Capillary refill takes less than 2 seconds.  Neurological:     Mental Status: She is alert.  Psychiatric:        Mood and Affect: Mood normal.     ED Results / Procedures / Treatments   Labs (all labs ordered are listed, but only abnormal results are displayed) Labs Reviewed  COMPREHENSIVE METABOLIC PANEL - Abnormal; Notable for the following components:      Result Value   CO2 21 (*)    Glucose, Bld 100 (*)    All other components within normal limits  CBC - Abnormal; Notable for the following components:    RBC 5.12 (*)    Hemoglobin 16.2 (*)    HCT 48.0 (*)    All other components within normal limits  LIPASE, BLOOD  HCG, SERUM, QUALITATIVE  URINALYSIS, ROUTINE W REFLEX MICROSCOPIC    EKG None  Radiology No results found.  Procedures Procedures    Medications Ordered in ED Medications  alum & mag hydroxide-simeth (MAALOX/MYLANTA) 200-200-20 MG/5ML suspension 30 mL (has no administration in time range)    And  lidocaine (XYLOCAINE) 2 % viscous mouth solution 15 mL (has no administration in time range)    ED Course/ Medical Decision Making/ A&P                             Medical Decision Making Amount and/or Complexity of Data Reviewed Labs: ordered. Radiology: ordered.  Risk OTC drugs. Prescription drug management.  This patient presents to the ED for concern of abdominal pain, this involves an extensive number of treatment options, and is a complaint that carries with it a high risk of complications and morbidity. The differential diagnosis for generalized abdominal pain includes, but is not limited to AAA, gastroenteritis, appendicitis, Bowel obstruction, Bowel perforation. Gastroparesis, DKA, Hernia, Inflammatory bowel disease, mesenteric ischemia, pancreatitis, peritonitis SBP, volvulus.   Co morbidities that complicate the patient evaluation   umbilical hernia, hypertension  My initial workup includes labs, imaging, symptom control  Additional history obtained from: Nursing notes from this visit.  I ordered, reviewed and interpreted labs which include: CBC, CMP, lipase, hCG, urinalysis.  Urinalysis pending.  Bicarb of 21.  Labs otherwise reassuring.  I ordered imaging studies including CT abdomen pelvis I independently visualized and interpreted imaging which showed pending at the time of shift change  Afebrile, hemodynamically stable.  42 year old female presenting to the ED for evaluation of abdominal pain.  This began as epigastric abdominal pain, however  has progressed.  She also has a periumbilical hernia.  Pain is described as burning.  Her lab workup was reassuring.  No leukocytosis.  Lipase is within normal limits.  Electrolytes are normal.  Overall I do suspect that this is dyspepsia, however she had some significant tenderness to palpation specifically in the right lower quadrant and so a CT abdomen pelvis was ordered to rule out acute appendicitis. Care will be handed off to the oncoming provider pending results. Please see his note for final disposition and decision making.   Note: Portions of this report may have been transcribed using voice recognition software. Every effort was made to ensure accuracy; however, inadvertent computerized  transcription errors may still be present.        Final Clinical Impression(s) / ED Diagnoses Final diagnoses:  None    Rx / DC Orders ED Discharge Orders     None         Mora Bellman 06/19/23 1528    Linwood Dibbles, MD 06/20/23 1039

## 2023-06-26 ENCOUNTER — Encounter: Payer: Self-pay | Admitting: Student

## 2023-06-26 ENCOUNTER — Other Ambulatory Visit: Payer: Self-pay | Admitting: Student

## 2023-06-26 ENCOUNTER — Other Ambulatory Visit (HOSPITAL_COMMUNITY)
Admission: RE | Admit: 2023-06-26 | Discharge: 2023-06-26 | Disposition: A | Discharge status: Home / Self Care | Payer: Medicaid Other | Source: Ambulatory Visit | Attending: Internal Medicine | Admitting: Internal Medicine

## 2023-06-26 ENCOUNTER — Ambulatory Visit: Payer: Medicaid Other | Admitting: Student

## 2023-06-26 VITALS — BP 133/102 | HR 76 | Ht 68.0 in | Wt 177.4 lb

## 2023-06-26 DIAGNOSIS — K219 Gastro-esophageal reflux disease without esophagitis: Secondary | ICD-10-CM

## 2023-06-26 DIAGNOSIS — Z0000 Encounter for general adult medical examination without abnormal findings: Secondary | ICD-10-CM | POA: Diagnosis not present

## 2023-06-26 DIAGNOSIS — I10 Essential (primary) hypertension: Secondary | ICD-10-CM | POA: Diagnosis not present

## 2023-06-26 DIAGNOSIS — Z124 Encounter for screening for malignant neoplasm of cervix: Secondary | ICD-10-CM

## 2023-06-26 DIAGNOSIS — K429 Umbilical hernia without obstruction or gangrene: Secondary | ICD-10-CM | POA: Diagnosis present

## 2023-06-26 MED ORDER — FAMOTIDINE 20 MG PO TABS
20.0000 mg | ORAL_TABLET | Freq: Two times a day (BID) | ORAL | 1 refills | Status: DC | PRN
Start: 2023-06-26 — End: 2023-06-26

## 2023-06-26 NOTE — Assessment & Plan Note (Signed)
The patient was seen on 06/19/2023 in the emergency for abdominal pain.  CT abdomen pelvis with contrast was obtained which showed small bowel obstruction with a transition point at the level of a bowel containing umbilical hernia.  There is no evidence of bowel ischemia, and the hernia was reduced in the ED which led to resolution of the patient's symptoms.  The patient states that her hernia has been present since 2020, and started during one of her pregnancies.  She has never had issues with the hernia, until this most recent ED visit.  Patient denies nausea vomiting, abdominal pain.  They are having 1 bowel movement per day with loose stools, no blood no pain with defecation.  On exam today she has mild epigastric tenderness as well as mild tenderness of the umbilical region with deep palpation.  Umbilical hernia is present, and reducible.  Plan: - Given this patient's history of umbilical hernia with small bowel obstruction, we feel that this patient may likely be an appropriate candidate for surgical hernia repair. -Referral to general surgery for definitive management of her hernia.

## 2023-06-26 NOTE — Assessment & Plan Note (Addendum)
Patient last Pap smear in 2018, NILM negative HPV. No mammogram on file  Plan: - Pap with HPV cotesting today - Referral for mammogram, per updated USPSTF guidelines

## 2023-06-26 NOTE — Progress Notes (Signed)
Subjective:  CC: Hospital follow-up, new patient establish care  HPI:  [name] is a 42 y.o. female with a past medical history stated below and presents today for hospital follow-up for SBO in the setting of umbilical hernia, establish care. Please see problem based assessment and plan for additional details.  Past Medical History:  Diagnosis Date   Hypertension    PONV (postoperative nausea and vomiting)     Current Outpatient Medications on File Prior to Visit  Medication Sig Dispense Refill   amLODipine (NORVASC) 10 MG tablet Take 1 tablet (10 mg total) by mouth daily. 30 tablet 1   ibuprofen (ADVIL) 800 MG tablet Take 1 tablet (800 mg total) by mouth every 8 (eight) hours as needed. 30 tablet 0   polyethylene glycol powder (GLYCOLAX/MIRALAX) 17 GM/SCOOP powder Take 255 g by mouth in the morning, at noon, and at bedtime. One scoop in beverage of your choice with each meal. You may increase if you continue to be constipated and decrease if your stool becomes too loose. 255 g 0   No current facility-administered medications on file prior to visit.    Family History  Problem Relation Age of Onset   Early death Mother        mva   Heart disease Father    Hyperlipidemia Father    Arthritis Paternal Grandmother    Hyperlipidemia Paternal Grandmother    Hyperlipidemia Paternal Grandfather     Social History   Socioeconomic History   Marital status: Married    Spouse name: Not on file   Number of children: Not on file   Years of education: Not on file   Highest education level: Not on file  Occupational History   Not on file  Tobacco Use   Smoking status: Former    Current packs/day: 0.50    Types: Cigarettes   Smokeless tobacco: Never  Vaping Use   Vaping status: Never Used  Substance and Sexual Activity   Alcohol use: Yes    Comment: sometimes   Drug use: No   Sexual activity: Yes    Birth control/protection: None  Other Topics Concern   Not  on file  Social History Narrative   Not on file   Social Determinants of Health   Financial Resource Strain: Low Risk  (09/15/2019)   Overall Financial Resource Strain (CARDIA)    Difficulty of Paying Living Expenses: Not hard at all  Food Insecurity: No Food Insecurity (09/15/2019)   Hunger Vital Sign    Worried About Running Out of Food in the Last Year: Never true    Ran Out of Food in the Last Year: Never true  Transportation Needs: No Transportation Needs (09/15/2019)   PRAPARE - Administrator, Civil Service (Medical): No    Lack of Transportation (Non-Medical): No  Physical Activity: Not on file  Stress: Not on file  Social Connections: Not on file  Intimate Partner Violence: Not on file    Review of Systems: ROS negative except for what is noted on the assessment and plan.  Objective:   Vitals:   06/26/23 1103 06/26/23 1212  BP: (!) 143/105 (!) 133/102  Pulse: 76   SpO2: 100%   Weight: 177 lb 6.4 oz (80.5 kg)   Height: 5\' 8"  (1.727 m)     Physical Exam: Constitutional: well-appearing in no acute distress HENT: normocephalic atraumatic, mucous membranes moist Eyes: conjunctiva non-erythematous Neck: supple Cardiovascular: regular rate and rhythm, no m/r/g  Pulmonary/Chest: normal work of breathing on room air, lungs clear to auscultation bilaterally Abdominal: mild epigastric tenderness as well as mild tenderness of the umbilical region with deep palpation.  Umbilical hernia is present, and reducible. MSK: normal bulk and tone Skin: warm and dry Psych: Normal mood and affect   Assessment & Plan:  Umbilical hernia without obstruction and without gangrene The patient was seen on 06/19/2023 in the emergency for abdominal pain.  CT abdomen pelvis with contrast was obtained which showed small bowel obstruction with a transition point at the level of a bowel containing umbilical hernia.  There is no evidence of bowel ischemia, and the hernia was reduced in  the ED which led to resolution of the patient's symptoms.  The patient states that her hernia has been present since 2020, and started during one of her pregnancies.  She has never had issues with the hernia, until this most recent ED visit.  Patient denies nausea vomiting, abdominal pain.  They are having 1 bowel movement per day with loose stools, no blood no pain with defecation.  On exam today she has mild epigastric tenderness as well as mild tenderness of the umbilical region with deep palpation.  Umbilical hernia is present, and reducible.  Plan: - Given this patient's history of umbilical hernia with small bowel obstruction, we feel that this patient may likely be an appropriate candidate for surgical hernia repair. -Referral to general surgery for definitive management of her hernia.  Gastroesophageal reflux disease Patient endorses epigastric pain, burning pain in the that radiates into the chest sometimes after meals.  She says this has been present since her pregnancy in 2020.  She has no weight loss, cough, vomiting or regurgitation.  She says her symptoms are mild and have not been a major concern for her.  Plan: - Famotidine 20 mg twice daily as needed  Healthcare maintenance Patient last Pap smear in 2018, NILM negative HPV. No mammogram on file  Plan: - Pap with HPV cotesting today - Referral for mammogram, per updated USPSTF guidelines  Hypertension Patient says she was prescribed amlodipine for hypertension around the time of her pregnancy, it is unclear who is prescribing refilling this medication but the patient says she has a supply of this medication and is taking it consistently.  She did not take her blood pressure medication this a.m. before her visit blood pressure today 133/102.  She says she takes her blood pressure at home which is usually in the 125-140 systolic, 90-100 diastolic range.  Plan: - Continue amlodipine 10 - Bring in blood pressure cuff next  visit for correlation with office readings. - Follow-up 4 weeks.   Patient seen with Dr. Heide Scales MD [facility]  PGY-1 Pager: [phone]  Phone: [phone] Date 06/26/2023  Time 1:45 PM

## 2023-06-26 NOTE — Patient Instructions (Addendum)
Thank you, Shelley Rojas for allowing [place] to provide your care today. Today we discussed  Hernia, routine care.    I have ordered the following labs for you:  Pap Cytology     Referrals ordered today:   Referral Orders         Ambulatory referral to General Surgery    Mammogram    I have ordered the following medication/changed the following medications:   Stop the following medications: Medications Discontinued During This Encounter  Medication Reason   aspirin EC 81 MG tablet    Doxylamine Succinate, Sleep, (UNISOM PO)    hydrochlorothiazide (HYDRODIURIL) 25 MG tablet    Prenatal Vit-Fe Fumarate-FA (MULTIVITAMIN-PRENATAL) 27-0.8 MG TABS tablet    Pyridoxine HCl (B-6 PO)      Start the following medications: Meds ordered this encounter  Medications   famotidine (PEPCID) 20 MG tablet    Sig: Take 1 tablet (20 mg total) by mouth 2 (two) times daily as needed for heartburn or indigestion.    Dispense:  60 tablet    Refill:  1     Follow up:  4 weeks    We look forward to seeing you next time. Please call our clinic at [phone] if you have any questions or concerns. The best time to call is Monday-Friday from 9am-4pm, but there is someone available 24/7. If after hours or the weekend, call the main hospital number and ask for the Internal Medicine Resident On-Call. If you need medication refills, please notify your pharmacy one week in advance and they will send [place] a request.   Thank you for trusting me with your care. Wishing you the best!  Lovie Macadamia MD [facility]

## 2023-06-26 NOTE — Assessment & Plan Note (Signed)
Patient endorses epigastric pain, burning pain in the that radiates into the chest sometimes after meals.  She says this has been present since her pregnancy in 2020.  She has no weight loss, cough, vomiting or regurgitation.  She says her symptoms are mild and have not been a major concern for her.  Plan: - Famotidine 20 mg twice daily as needed

## 2023-06-26 NOTE — Assessment & Plan Note (Signed)
Patient says she was prescribed amlodipine for hypertension around the time of her pregnancy, it is unclear who is prescribing refilling this medication but the patient says she has a supply of this medication and is taking it consistently.  She did not take her blood pressure medication this a.m. before her visit blood pressure today 133/102.  She says she takes her blood pressure at home which is usually in the 125-140 systolic, 90-100 diastolic range.  Plan: - Continue amlodipine 10 - Bring in blood pressure cuff next visit for correlation with office readings. - Follow-up 4 weeks.

## 2023-06-27 LAB — CYTOLOGY - PAP
Adequacy: ABSENT
Comment: NEGATIVE
Diagnosis: NEGATIVE
High risk HPV: NEGATIVE

## 2023-06-27 NOTE — Progress Notes (Signed)
Internal Medicine Clinic Attending  I was physically present during the key portions of the resident provided service and participated in the medical decision making of patient's management care. I reviewed pertinent patient test results.  The assessment, diagnosis, and plan were formulated together and I agree with the documentation in the resident's note.  Narendra, Nischal, MD  

## 2023-07-18 ENCOUNTER — Ambulatory Visit: Payer: Medicaid Other

## 2023-07-22 ENCOUNTER — Ambulatory Visit
Admission: RE | Admit: 2023-07-22 | Discharge: 2023-07-22 | Disposition: A | Discharge status: Home / Self Care | Payer: Medicaid Other | Source: Ambulatory Visit | Attending: Internal Medicine | Admitting: Internal Medicine

## 2023-07-22 DIAGNOSIS — Z0000 Encounter for general adult medical examination without abnormal findings: Secondary | ICD-10-CM

## 2023-07-24 ENCOUNTER — Other Ambulatory Visit: Payer: Self-pay | Admitting: Internal Medicine

## 2023-07-24 DIAGNOSIS — R928 Other abnormal and inconclusive findings on diagnostic imaging of breast: Secondary | ICD-10-CM

## 2023-08-07 ENCOUNTER — Ambulatory Visit
Admission: RE | Admit: 2023-08-07 | Discharge: 2023-08-07 | Disposition: A | Discharge status: Home / Self Care | Payer: Medicaid Other | Source: Ambulatory Visit | Attending: Internal Medicine | Admitting: Internal Medicine

## 2023-08-07 DIAGNOSIS — R928 Other abnormal and inconclusive findings on diagnostic imaging of breast: Secondary | ICD-10-CM

## 2023-08-08 ENCOUNTER — Other Ambulatory Visit: Payer: Self-pay | Admitting: Student

## 2023-08-08 DIAGNOSIS — Z0000 Encounter for general adult medical examination without abnormal findings: Secondary | ICD-10-CM

## 2023-08-08 DIAGNOSIS — N63 Unspecified lump in unspecified breast: Secondary | ICD-10-CM

## 2023-08-08 NOTE — Progress Notes (Signed)
Patient with additional imaging of the breast recently, findings BI-rads 3. Recommended f/u w/ repeat Breast US in 6 mo. Ordering breast US for 6 mo. I spoke with Smitty Cords on the phone. Patient's identity was confirmed using two patient specific identifiers. We discussed her results and the orders placed.

## 2023-09-17 ENCOUNTER — Other Ambulatory Visit: Payer: Self-pay

## 2023-09-17 ENCOUNTER — Other Ambulatory Visit: Payer: Self-pay | Admitting: Student

## 2023-09-17 ENCOUNTER — Ambulatory Visit: Payer: Medicaid Other | Admitting: Student

## 2023-09-17 ENCOUNTER — Encounter: Payer: Self-pay | Admitting: Student

## 2023-09-17 VITALS — BP 154/106 | HR 75 | Temp 99.0°F | Ht 69.0 in | Wt 175.4 lb

## 2023-09-17 DIAGNOSIS — R200 Anesthesia of skin: Secondary | ICD-10-CM

## 2023-09-17 DIAGNOSIS — M25531 Pain in right wrist: Secondary | ICD-10-CM | POA: Insufficient documentation

## 2023-09-17 DIAGNOSIS — I10 Essential (primary) hypertension: Secondary | ICD-10-CM

## 2023-09-17 DIAGNOSIS — K429 Umbilical hernia without obstruction or gangrene: Secondary | ICD-10-CM

## 2023-09-17 DIAGNOSIS — D751 Secondary polycythemia: Secondary | ICD-10-CM | POA: Diagnosis not present

## 2023-09-17 DIAGNOSIS — E663 Overweight: Secondary | ICD-10-CM | POA: Diagnosis not present

## 2023-09-17 DIAGNOSIS — Z6825 Body mass index (BMI) 25.0-25.9, adult: Secondary | ICD-10-CM | POA: Diagnosis not present

## 2023-09-17 DIAGNOSIS — R739 Hyperglycemia, unspecified: Secondary | ICD-10-CM

## 2023-09-17 DIAGNOSIS — R631 Polydipsia: Secondary | ICD-10-CM

## 2023-09-17 DIAGNOSIS — R350 Frequency of micturition: Secondary | ICD-10-CM

## 2023-09-17 DIAGNOSIS — E559 Vitamin D deficiency, unspecified: Secondary | ICD-10-CM

## 2023-09-17 MED ORDER — LOSARTAN POTASSIUM-HCTZ 50-12.5 MG PO TABS
1.0000 | ORAL_TABLET | Freq: Every day | ORAL | 0 refills | Status: DC
Start: 2023-09-17 — End: 2023-11-28

## 2023-09-17 NOTE — Progress Notes (Addendum)
Subjective:  CC: Follow up  HPI:  [name] is a 42 y.o. person with a past medical history stated below and presents today for Follow up. Please see problem based assessment and plan for additional details.  Past Medical History:  Diagnosis Date   Hypertension    PONV (postoperative nausea and vomiting)     Current Outpatient Medications on File Prior to Visit  Medication Sig Dispense Refill   famotidine (PEPCID) 20 MG tablet TAKE 1 TABLET(20 MG) BY MOUTH TWICE DAILY AS NEEDED FOR HEARTBURN OR INDIGESTION 180 tablet 3   ibuprofen (ADVIL) 800 MG tablet Take 1 tablet (800 mg total) by mouth every 8 (eight) hours as needed. 30 tablet 0   polyethylene glycol powder (GLYCOLAX/MIRALAX) 17 GM/SCOOP powder Take 255 g by mouth in the morning, at noon, and at bedtime. One scoop in beverage of your choice with each meal. You may increase if you continue to be constipated and decrease if your stool becomes too loose. 255 g 0   No current facility-administered medications on file prior to visit.    Review of Systems: Please see assessment and plan for pertinent positives and negatives.  Objective:   Vitals:   09/17/23 1518  BP: (!) 154/106  Pulse: 75  Temp: 99 F (37.2 C)  TempSrc: Oral  SpO2: 100%  Weight: 175 lb 6.4 oz (79.6 kg)  Height: 5\' 9"  (1.753 m)    Physical Exam: Constitutional: Well-appearing, in no acute distress Cardiovascular: regular rate and rhythm, no m/r/g Pulmonary/Chest: normal work of breathing on room air, lungs clear to auscultation bilaterally Abdominal: soft, non-tender, non-distended. Umbilical hernia present, non-tender. Extremities: No edema of the lower extremities bilaterally MSK: On elbow flexion she has pain in the posterior (extensor surface) of the forearm.  With wrist flexion she has pain in the lateral aspect of the forearm.  She has some pain with palpation of the proximal forearm muscles. Plan: Skin: warm and dry Psych:  Pleasant mood and affect     Assessment & Plan:  Numbness Patient had an episode of numbness of the lateral portion of her right left about three months ago. No weakness or pain. Denies trauma. Resolved on its own in the three weeks. Plan: Patient to inform us if she has any new numbness, weakness, vision changes, or other neurological symptoms.     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  Patient does not endorse much sleeping, but does endorse nocturnal awakenings.  Denies nocturnal shortness of breath. Plan: Sleep study in setting of polycythemia, hypertension, possible sleep apnea CBC   Umbilical hernia without obstruction and without gangrene Patient was unable to get into general surgery for definitive management of her umbilical hernia.  She states that she was contacted by 2 general surgeons but neither took her insurance.  No signs of strangulation or complications since hospitalization. Plan: Repeat referral to general surgery   Vitamin D deficiency History of vitamin D deficiency, unclear if this was treated or resolved. Plan: Vitamin D level   Hyperglycemia Hyperglycemia on prior metabolic panels.  Patient also endorses some polyuria, polydipsia. Plan: Hemoglobin A1c lab   Hypertension At last visit, it was believed that the patient was on amlodipine 10.  Since then the patient clarified that she was previously on amlodipine 10 during her pregnancy, but afterwards she was taking hydrochlorothiazide 50 mg.  She continues to take HCTZ 50 mg which is refilled by another provider.  She takes her blood pressures at home which are usually in the 140s to 150s systolic.  Plan: Switch to losartan HCTZ 50-12.5 once daily.   Right wrist pain Patient is endorsing a 2-week history of right wrist/forearm pain.  She says the pain is not present at rest, but is worse with resistance such as picking up her daughter.  On elbow  flexion she has pain in the posterior (extensor surface) of the forearm.  With wrist flexion she has pain in the lateral aspect of the forearm.  She has some pain with palpation of the proximal forearm muscles. Plan: Favored to be muscular strain, will try short course scheduled NSAIDs to see if symptoms improve.   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 Will assess lipid for risk stratification.     Patient seen with Dr. Heide Scales MD [facility]  PGY-1 Pager: [phone]  Phone: [phone] Date 09/17/2023  Time 10:37 PM

## 2023-09-17 NOTE — Assessment & Plan Note (Signed)
Patient was unable to get into general surgery for definitive management of her umbilical hernia.  She states that she was contacted by 2 general surgeons but neither took her insurance.  No signs of strangulation or complications since hospitalization. Plan: Repeat referral to general surgery

## 2023-09-17 NOTE — Assessment & Plan Note (Signed)
History of vitamin D deficiency, unclear if this was treated or resolved. Plan: Vitamin D level

## 2023-09-17 NOTE — Assessment & Plan Note (Signed)
BMI >25 w/ comorbidity of HTN. Will assess lipid for risk stratification.

## 2023-09-17 NOTE — Assessment & Plan Note (Signed)
Patient is endorsing a 2-week history of right wrist/forearm pain.  She says the pain is not present at rest, but is worse with resistance such as picking up her daughter.  On elbow flexion she has pain in the posterior (extensor surface) of the forearm.  With wrist flexion she has pain in the lateral aspect of the forearm.  She has some pain with palpation of the proximal forearm muscles. Plan: Favored to be muscular strain, will try short course scheduled NSAIDs to see if symptoms improve.

## 2023-09-17 NOTE — Assessment & Plan Note (Signed)
At last visit, it was believed that the patient was on amlodipine 10.  Since then the patient clarified that she was previously on amlodipine 10 during her pregnancy, but afterwards she was taking hydrochlorothiazide 50 mg.  She continues to take HCTZ 50 mg which is refilled by another provider.  She takes her blood pressures at home which are usually in the 140s to 150s systolic.  Plan: Switch to losartan HCTZ 50-12.5 once daily.

## 2023-09-17 NOTE — Patient Instructions (Signed)
Thank you, Shelley Rojas for allowing [place] to provide your care today.  I have ordered the following tests for you:   Lab Orders         Lipid Profile         Hemoglobin A1c         Vitamin D (25 hydroxy)         CBC no Diff       Referrals ordered today:    Referral Orders         Ambulatory referral to General Surgery         Ambulatory referral to Sleep Studies      I have ordered the following medication/changed the following medications:   Stop the following medications: Medications Discontinued During This Encounter  Medication Reason   amLODipine (NORVASC) 10 MG tablet      Start the following medications: Meds ordered this encounter  Medications   losartan-hydrochlorothiazide (HYZAAR) 50-12.5 MG tablet    Sig: Take 1 tablet by mouth daily.    Dispense:  30 tablet    Refill:  0       Follow up:  4 weeks  for blood pressure  Please remember: Please take scheduled ibuprofen for one week to see if your symptoms.    We look forward to seeing you next time. Please call our clinic at [phone] if you have any questions or concerns. The best time to call is Monday-Friday from 9am-4pm, but there is someone available 24/7. If after hours or the weekend, call the main hospital number and ask for the Internal Medicine Resident On-Call. If you need medication refills, please notify your pharmacy one week in advance and they will send [place] a request.   Thank you for trusting me with your care. Wishing you the best!  Lovie Macadamia MD [facility]

## 2023-09-17 NOTE — Assessment & Plan Note (Signed)
Patient had an episode of numbness of the lateral portion of her right left about three months ago. No weakness or pain. Denies trauma. Resolved on its own in the three weeks. Plan: Patient to inform us if she has any new numbness, weakness, vision changes, or other neurological symptoms.

## 2023-09-17 NOTE — Assessment & Plan Note (Signed)
Hyperglycemia on prior metabolic panels.  Patient also endorses some polyuria, polydipsia. Plan: Hemoglobin A1c lab

## 2023-09-17 NOTE — Assessment & Plan Note (Addendum)
Polycythemia on prior CBC, unclear etiology, may be related to sleep apnea.  Patient does not endorse much sleeping, but does endorse nocturnal awakenings.  Denies nocturnal shortness of breath. Plan: Sleep study in setting of polycythemia, hypertension, possible sleep apnea CBC

## 2023-09-18 LAB — LIPID PANEL
Chol/HDL Ratio: 4.8 {ratio} — ABNORMAL HIGH (ref 0.0–4.4)
Cholesterol, Total: 259 mg/dL — ABNORMAL HIGH (ref 100–199)
HDL: 54 mg/dL (ref >39)
LDL Chol Calc (NIH): 181 mg/dL — ABNORMAL HIGH (ref 0–99)
Triglycerides: 135 mg/dL (ref 0–149)
VLDL Cholesterol Cal: 24 mg/dL (ref 5–40)

## 2023-09-18 LAB — CBC
Hematocrit: 47.1 % — ABNORMAL HIGH (ref 34.0–46.6)
Hemoglobin: 15.5 g/dL (ref 11.1–15.9)
MCH: 31.4 pg (ref 26.6–33.0)
MCHC: 32.9 g/dL (ref 31.5–35.7)
MCV: 96 fL (ref 79–97)
Platelets: 214 10*3/uL (ref 150–450)
RBC: 4.93 x10E6/uL (ref 3.77–5.28)
RDW: 12.6 % (ref 11.7–15.4)
WBC: 6.4 10*3/uL (ref 3.4–10.8)

## 2023-09-18 LAB — HEMOGLOBIN A1C
Est. average glucose Bld gHb Est-mCnc: 100 mg/dL
Hgb A1c MFr Bld: 5.1 % (ref 4.8–5.6)

## 2023-09-18 LAB — VITAMIN D 25 HYDROXY (VIT D DEFICIENCY, FRACTURES): Vit D, 25-Hydroxy: 29.3 ng/mL — ABNORMAL LOW (ref 30.0–100.0)

## 2023-09-23 ENCOUNTER — Telehealth: Payer: Self-pay | Admitting: Student

## 2023-09-23 DIAGNOSIS — E785 Hyperlipidemia, unspecified: Secondary | ICD-10-CM

## 2023-09-23 MED ORDER — ATORVASTATIN CALCIUM 20 MG PO TABS
20.0000 mg | ORAL_TABLET | Freq: Every day | ORAL | 11 refills | Status: AC
Start: 2023-09-23 — End: 2024-09-22

## 2023-09-23 NOTE — Telephone Encounter (Signed)
I spoke with Shelley Rojas on the phone. Patient's identity was confirmed using two patient specific identifiers. We discussed her lab results. Recommend Vit D supplement and to f/u with sleep medicine. We also discussed her lipid panel - significant for LDL >180. I discussed that her ASCVD was relatively low still and I would be open to medication or lifestyle modification. Patient would like to be placed on statin. Will initiate Lipitor 20mg .

## 2023-09-24 NOTE — Addendum Note (Signed)
Addended by: Earl Lagos on: 09/24/2023 10:55 AM   Modules accepted: Level of Service

## 2023-09-24 NOTE — Progress Notes (Signed)
Internal Medicine Clinic Attending  I was physically present during the key portions of the resident provided service and participated in the medical decision making of patient's management care. I reviewed pertinent patient test results.  The assessment, diagnosis, and plan were formulated together and I agree with the documentation in the resident's note.  Narendra, Nischal, MD  

## 2023-10-21 ENCOUNTER — Encounter: Payer: Medicaid Other | Admitting: Student

## 2023-11-11 ENCOUNTER — Encounter: Payer: Self-pay | Admitting: Neurology

## 2023-11-11 ENCOUNTER — Ambulatory Visit: Payer: Medicaid Other | Admitting: Neurology

## 2023-11-11 VITALS — BP 151/100 | HR 65 | Ht 69.0 in | Wt 178.6 lb

## 2023-11-11 DIAGNOSIS — G4719 Other hypersomnia: Secondary | ICD-10-CM | POA: Insufficient documentation

## 2023-11-11 DIAGNOSIS — M2624 Reverse articulation: Secondary | ICD-10-CM | POA: Insufficient documentation

## 2023-11-11 DIAGNOSIS — R718 Other abnormality of red blood cells: Secondary | ICD-10-CM | POA: Insufficient documentation

## 2023-11-11 DIAGNOSIS — R351 Nocturia: Secondary | ICD-10-CM | POA: Insufficient documentation

## 2023-11-11 DIAGNOSIS — I10 Essential (primary) hypertension: Secondary | ICD-10-CM | POA: Diagnosis not present

## 2023-11-11 NOTE — Patient Instructions (Signed)
ASSESSMENT AND PLAN 42 y.o.  female  here with:    1)  Excessive daytime sleepiness, but not a snorer. Nocturia.   2) elevated hematocrit.  3) vaping tobacco since changing from smoking 2 years ago.   4) High grade Mallampati and crossbite. Risk factors for OSA.   Plan : I discussed with Shelley Rojas basic sleep hygiene rules, restricting fluid intake towards the hours of bedtime and during hours of bedtime, restricting the exposure to bright screen light. As a mother of 2 young children she is a light sleeper.  I intend to order a home sleep test to see if she has hypoxemia, thus getting an explanation for the reason of the elevated hematocrit.  It is also mentioned to rule out the cause of obstructive sleep apnea to explain excessive daytime sleepiness.  If apnea is not present I would like to pursue a polysomnography with follow-up MSLT to evaluate for other causes of hypersomnia even idiopathic hypersomnia.   I plan to follow up either personally or through our NP within 3 months.   I would like to thank Lovie Macadamia, MD and Earl Lagos, Md [address], [address] [place],  [place] [address] for allowing me to meet with and to take care of this pleasant patient.

## 2023-11-11 NOTE — Progress Notes (Signed)
SLEEP MEDICINE CLINIC    Provider:  Melvyn Novas, MD  Primary Care Physician:  Lovie Macadamia, MD [address] [place] [address]     Referring Provider: Earl Lagos, Md [address] [place],  [place] [address]          Chief Complaint according to patient   Patient presents with:     New Patient (Initial Visit)      Excessive daytime sleepiness, onset with third  pregnancy, 7 years ago.  Elevated HCT      HISTORY OF PRESENT ILLNESS:  Shelley Rojas is a 42 y.o. female patient who is seen upon referral on 11/11/2023 from PCP Dr Ival Bible  for a Sleep Medicine Consultation.  Chief concern according to patient :  " I have not been fatigued, I am sleepy"  I have the pleasure of seeing Shelley Rojas on 11/11/23 , who is a married mother of 4, a right-handed female with a possible sleep disorder.       Sleep relevant medical history: Nocturia 3-5 times!  No Tonsillectomy, no TBi or cervical spine injuries, no thyroid diease, no anemia outside of pregnancy.    Family medical /sleep history: she was an only child, parents deceased,  father had probably OSA.    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 The patient currently works as a Arts development officer, she is going to be a grandmother again. Pets are present: 2 birds and a husky.  Tobacco use: vaping - actively.   ETOH use : 2 beers a week- Caffeine intake in form of Coffee( 4 cups ) Soda( rare) Tea ( rare) nor energy drinks. Exercise in form of child care.      Sleep habits are as follows: The patient's dinner time is between 6-8 PM. The patient goes to bed at 11 PM and continues to sleep for 6 hours, wakes for several  bathroom breaks, the first time at 1 AM.  She is looking at her pone a lot.  The preferred sleep position is  laterally, with the support of 1 pillow, flat bed. Dreams are reportedly frequent/vivid. The patient wakes up spontaneously , but sometimes needs an alarm. 6  AM is the usual rise time. She reports not feeling refreshed or restored in AM, with symptoms such as dry mouth, residual fatigue. Naps are not taken.    Review of Systems: Out of a complete 14 system review, the patient complains of only the following symptoms, and all other reviewed systems are negative.:  Sleepiness ,  rare snoring, fragmented sleep, Nocturia every 90 minutes.   No sleep attacks, no cataplexy,    How likely are you to doze in the following situations: 0 = not likely, 1 = slight chance, 2 = moderate chance, 3 = high chance   Sitting and Reading? Watching Television? Sitting inactive in a public place (theater or meeting)? As a passenger in a car for an hour without a break? Lying down in the afternoon when circumstances permit? Sitting and talking to someone? Sitting quietly after lunch without alcohol? In a car, while stopped for a few minutes in traffic?   Total = 14/ 24 points   FSS endorsed at 25/ 63 points.   Social History  Socioeconomic History   Marital status: Married    Spouse name: Not on file   Number of children: Not on file   Years of education: Not on file   Highest education level: Not on file  Occupational History   Not on file  Tobacco Use   Smoking status: Former    Current packs/day: 0.50    Types: Cigarettes   Smokeless tobacco: Never  Vaping Use   Vaping status: Every Day  Substance and Sexual Activity   Alcohol use: Yes    Alcohol/week: 3.0 standard drinks of alcohol    Types: 3 Cans of beer per week   Drug use: No   Sexual activity: Yes    Birth control/protection: None  Other Topics Concern   Not on file  Social History Narrative   Pt lives with husband witn 3 kids    Pt doesn't work    International aid/development worker of Corporate investment banker Strain: Low Risk   (09/15/2019)   Overall Financial Resource Strain (CARDIA)    Difficulty of Paying Living Expenses: Not hard at all  Food Insecurity: No Food Insecurity (09/15/2019)   Hunger Vital Sign    Worried About Running Out of Food in the Last Year: Never true    Ran Out of Food in the Last Year: Never true  Transportation Needs: No Transportation Needs (09/15/2019)   PRAPARE - Administrator, Civil Service (Medical): No    Lack of Transportation (Non-Medical): No  Physical Activity: Not on file  Stress: Not on file  Social Connections: Not on file    Family History  Problem Relation Age of Onset   Early death Mother        mva   Heart disease Father    Hyperlipidemia Father    Arthritis Paternal Grandmother    Hyperlipidemia Paternal Grandmother    Hyperlipidemia Paternal Grandfather    Sleep apnea Neg Hx     Past Medical History:  Diagnosis Date   Hypertension    PONV (postoperative nausea and vomiting)     Past Surgical History:  Procedure Laterality Date   NO PAST SURGERIES       Current Outpatient Medications on File Prior to Visit  Medication Sig Dispense Refill   atorvastatin (LIPITOR) 20 MG tablet Take 1 tablet (20 mg total) by mouth daily. 30 tablet 11   famotidine (PEPCID) 20 MG tablet TAKE 1 TABLET(20 MG) BY MOUTH TWICE DAILY AS NEEDED FOR HEARTBURN OR INDIGESTION 180 tablet 3   ibuprofen (ADVIL) 800 MG tablet Take 1 tablet (800 mg total) by mouth every 8 (eight) hours as needed. 30 tablet 0   losartan-hydrochlorothiazide (HYZAAR) 50-12.5 MG tablet Take 1 tablet by mouth daily. 30 tablet 0   polyethylene glycol powder (GLYCOLAX/MIRALAX) 17 GM/SCOOP powder Take 255 g by mouth in the morning, at noon, and at bedtime. One scoop in beverage of your choice with each meal. You may increase if you continue to be constipated and decrease if your stool becomes too loose. 255 g 0   No current facility-administered medications on file prior to visit.    No Known  Allergies   DIAGNOSTIC DATA (LABS, IMAGING, TESTING) - I reviewed patient records, labs, notes, testing and imaging myself where available.  Lab Results  Component Value Date   WBC 6.4 09/17/2023   HGB 15.5 09/17/2023   HCT 47.1 (H) 09/17/2023   MCV 96 09/17/2023   PLT 214 09/17/2023  Component Value Date/Time   NA 135 06/19/2023 1245   NA 137 03/19/2019 1425   K 3.8 06/19/2023 1245   CL 103 06/19/2023 1245   CO2 21 (L) 06/19/2023 1245   GLUCOSE 100 (H) 06/19/2023 1245   BUN 10 06/19/2023 1245   BUN 7 03/19/2019 1425   CREATININE 0.85 06/19/2023 1245   CALCIUM 9.4 06/19/2023 1245   PROT 7.8 06/19/2023 1245   PROT 6.6 03/19/2019 1425   ALBUMIN 4.4 06/19/2023 1245   ALBUMIN 4.1 03/19/2019 1425   AST 16 06/19/2023 1245   ALT 17 06/19/2023 1245   ALKPHOS 55 06/19/2023 1245   BILITOT 0.9 06/19/2023 1245   BILITOT 0.3 03/19/2019 1425   GFRNONAA >60 06/19/2023 1245   GFRAA >60 09/18/2019 0819   Lab Results  Component Value Date   CHOL 259 (H) 09/17/2023   HDL 54 09/17/2023   LDLCALC 181 (H) 09/17/2023   TRIG 135 09/17/2023   CHOLHDL 4.8 (H) 09/17/2023   Lab Results  Component Value Date   HGBA1C 5.1 09/17/2023   No results found for: "VITAMINB12" No results found for: "TSH"  PHYSICAL EXAM:  Today's Vitals   11/11/23 1502  BP: (!) 151/100  Pulse: 65  Weight: 178 lb 9.6 oz (81 kg)  Height: 5\' 9"  (1.753 m)   Body mass index is 26.37 kg/m.   Wt Readings from Last 3 Encounters:  11/11/23 178 lb 9.6 oz (81 kg)  09/17/23 175 lb 6.4 oz (79.6 kg)  06/26/23 177 lb 6.4 oz (80.5 kg)     Ht Readings from Last 3 Encounters:  11/11/23 5\' 9"  (1.753 m)  09/17/23 5\' 9"  (1.753 m)  06/26/23 5\' 8"  (1.727 m)      General: The patient is awake, alert and appears not in acute distress. The patient is well groomed. Head: Normocephalic, atraumatic. Neck is supple. Mallampati 2-3,  neck circumference:14.5 inches . 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  Nasal airflow not fully  patent.  crossbite / overbite is  seen.  Dental status: poor. Dentures.  Cardiovascular:  Regular rate and cardiac rhythm by pulse,  without distended neck veins. Respiratory: Lungs are clear to auscultation.  Skin:  Without evidence of ankle edema, or rash. Trunk: The patient's posture is erect.   NEUROLOGIC EXAM: The patient is awake and alert, oriented to place and time.   Memory subjective described as intact.  Attention span & concentration ability appears normal.  Speech is fluent, without dysarthria, dysphonia or aphasia.  Mood and affect are appropriate.   Cranial nerves: no loss of smell or taste reported  Pupils are equal and briskly reactive to light. Funduscopic exam deferred.  Extraocular movements in vertical and horizontal planes were intact and without nystagmus. No Diplopia. Visual fields by finger perimetry are intact. Hearing was intact to soft voice and finger rubbing.    Facial sensation intact to fine touch.  Facial motor strength is symmetric and tongue and uvula move midline.  Neck ROM : rotation, tilt and flexion extension were normal for age and shoulder shrug was symmetrical.    Motor exam:  Symmetric bulk, tone and ROM.   Normal tone without cog wheeling, symmetric grip strength .   Sensory:  Fine touch, pinprick and vibration were tested  and  normal. Reports finger numbness. Foot pain- heel.  Proprioception tested in the upper extremities was normal.   Coordination: Rapid alternating movements in the fingers/hands were of normal speed.  The Finger-to-nose maneuver was intact without evidence of ataxia,  dysmetria or tremor.   Gait and station: Patient could rise unassisted from a seated position, walked without assistive device.  Stance is of normal width/ base .  Toe and heel walk were deferred.  Deep tendon reflexes: in the  upper and lower extremities are symmetric and intact.  Babinski response was deferred.    ASSESSMENT AND PLAN 42 y.o.   female  here with:    1)  Excessive daytime sleepiness, but not a snorer. Nocturia.   2) elevated hematocrit.  3) vaping tobacco since changing from smoking 2 years ago.   4) High grade Mallampati and crossbite. Risk factors for OSA.   Plan : I discussed with Mrs. Perdue basic sleep hygiene rules, restricting fluid intake towards the hours of bedtime and during hours of bedtime, restricting the exposure to bright screen light. As a mother of 2 young children she is a light sleeper.  I intend to order a home sleep test to see if she has hypoxemia, thus getting an explanation for the reason of the elevated hematocrit.  It is also mentioned to rule out the cause of obstructive sleep apnea to explain excessive daytime sleepiness.  If apnea is not present I would like to pursue a polysomnography with follow-up MSLT to evaluate for other causes of hypersomnia even idiopathic hypersomnia.   I plan to follow up either personally or through our NP within 3 months.   I would like to thank Lovie Macadamia, MD and Earl Lagos, Md [address], [address] [place],  [place] [address] for allowing me to meet with and to take care of this pleasant patient.    After spending a total time of  45  minutes face to face and additional time for physical and neurologic examination, review of laboratory studies,  personal review of imaging studies, reports and results of other testing and review of referral information / records as far as provided in visit,   Electronically signed by: Melvyn Novas, MD 11/11/2023 3:26 PM  [facility] Board certified by The [employer] of Sleep Medicine and Diplomate of the [employer] of Sleep Medicine. Board certified In Neurology through the ABPN, Fellow of the [employer] of Neurology.

## 2023-11-15 ENCOUNTER — Encounter: Payer: Medicaid Other | Admitting: Student

## 2023-11-27 ENCOUNTER — Ambulatory Visit: Payer: Medicaid Other | Admitting: Student

## 2023-11-27 VITALS — BP 126/100 | HR 74 | Temp 98.4°F | Ht 69.0 in | Wt 175.8 lb

## 2023-11-27 DIAGNOSIS — L03011 Cellulitis of right finger: Secondary | ICD-10-CM | POA: Diagnosis present

## 2023-11-27 DIAGNOSIS — G4733 Obstructive sleep apnea (adult) (pediatric): Secondary | ICD-10-CM

## 2023-11-27 DIAGNOSIS — I10 Essential (primary) hypertension: Secondary | ICD-10-CM

## 2023-11-27 DIAGNOSIS — D751 Secondary polycythemia: Secondary | ICD-10-CM

## 2023-11-27 NOTE — Progress Notes (Unsigned)
   CC: bp follow up  HPI:  Shelley Rojas is a 42 y.o. female living with a history stated below and presents today for ***. Please see problem based assessment and plan for additional details.  Past Medical History:  Diagnosis Date   Hypertension    PONV (postoperative nausea and vomiting)     Current Outpatient Medications on File Prior to Visit  Medication Sig Dispense Refill   atorvastatin (LIPITOR) 20 MG tablet Take 1 tablet (20 mg total) by mouth daily. 30 tablet 11   famotidine (PEPCID) 20 MG tablet TAKE 1 TABLET(20 MG) BY MOUTH TWICE DAILY AS NEEDED FOR HEARTBURN OR INDIGESTION 180 tablet 3   ibuprofen (ADVIL) 800 MG tablet Take 1 tablet (800 mg total) by mouth every 8 (eight) hours as needed. 30 tablet 0   losartan-hydrochlorothiazide (HYZAAR) 50-12.5 MG tablet Take 1 tablet by mouth daily. 30 tablet 0   polyethylene glycol powder (GLYCOLAX/MIRALAX) 17 GM/SCOOP powder Take 255 g by mouth in the morning, at noon, and at bedtime. One scoop in beverage of your choice with each meal. You may increase if you continue to be constipated and decrease if your stool becomes too loose. 255 g 0   No current facility-administered medications on file prior to visit.     Review of Systems: ROS negative except for what is noted on the assessment and plan.  There were no vitals filed for this visit.  Physical Exam: Constitutional: Well appearing, not in acute distress. Cardiovascular: regular rate and rhythm, no m/r/g Pulmonary/Chest: normal work of breathing on room air, lungs clear to auscultation bilaterally Abdominal: soft, non-tender, non-distended  Assessment & Plan:      Polycythemia Has followed with neurology, Polycythemia on prior CBC, unclear etiology, may be related to sleep apnea.  Patient does not endorse much sleeping, but does endorse nocturnal awakenings.  Denies nocturnal shortness of breath. Plan: Sleep study in setting of polycythemia, hypertension,  possible sleep apnea CBC      Vitamin D deficiency  -Will recheck vit D level today       Hypertension BP: (!) 126/100   BP today ***  .home bp monitoring **** She was switched to the combination pill losartan-hydrochlorothiazide 50-12.5.  Plan: Continue losartan-hydrochlorothiazide 50-12.5 mg. Will check BMP     Right wrist pain Patient is endorsing a 2-week history of right wrist/forearm pain.  She says the pain is not present at rest, but is worse with resistance such as picking up her daughter.  On elbow flexion she has pain in the posterior (extensor surface) of the forearm.  With wrist flexion she has pain in the lateral aspect of the forearm.  She has some pain with palpation of the proximal forearm muscles. Plan: Favored to be muscular strain, will try short course scheduled NSAIDs to see if symptoms improve.     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 Will assess lipid for risk stratification   Patient {GC/[id number]::"discussed with","seen with"} Dr. {[id number]::"[id number]","G. Hoffman","Mullen","Narendra","Vincent","Guilloud","Lau","Machen"}  No problem-specific Assessment & Plan notes found for this encounter.   Laretta Bolster, MD [facility], PGY-1 Phone: [phone] Date 11/27/2023 Time 8:26 AM

## 2023-11-27 NOTE — Patient Instructions (Signed)
Thank you, Shelley Rojas for allowing [place] to provide your care today.   Today we discussed :  1.  For blood pressure, will increase losartan/HCTZ  to 100-12.5 mg.  I will call you with the results of your lab.  2.  For the right finger nail infection, we drained the fluid collection in clinic.  Please put topical mupirocin on the right index fingernail bed.   3.  For concern for sleep apnea, follow-up with neurologist for either home sleep study or sleep study in the hospital.    I have ordered the following labs for you:  Lab Orders  No laboratory test(s) ordered today      Referrals ordered today:   Referral Orders  No referral(s) requested today     I have ordered the following medication/changed the following medications:   Stop the following medications: There are no discontinued medications.   Start the following medications: No orders of the defined types were placed in this encounter.    Follow up: 2 months   Please remember, if you have sudden worsening swelling of the right index finger, or fevers, please call the clinic.   Should you have any questions or concerns please call the internal medicine clinic at [phone].    Laretta Bolster, MD  [facility]

## 2023-11-28 DIAGNOSIS — L03011 Cellulitis of right finger: Secondary | ICD-10-CM | POA: Insufficient documentation

## 2023-11-28 LAB — BASIC METABOLIC PANEL
BUN/Creatinine Ratio: 15 (ref 9–23)
BUN: 12 mg/dL (ref 6–24)
CO2: 24 mmol/L (ref 20–29)
Calcium: 9.4 mg/dL (ref 8.7–10.2)
Chloride: 98 mmol/L (ref 96–106)
Creatinine, Ser: 0.8 mg/dL (ref 0.57–1.00)
Glucose: 94 mg/dL (ref 70–99)
Potassium: 4 mmol/L (ref 3.5–5.2)
Sodium: 137 mmol/L (ref 134–144)
eGFR: 95 mL/min/{1.73_m2} (ref >59)

## 2023-11-28 MED ORDER — LOSARTAN POTASSIUM-HCTZ 100-25 MG PO TABS: 1.0000 | ORAL_TABLET | Freq: Every day | ORAL | 3 refills | Status: AC

## 2023-11-28 NOTE — Assessment & Plan Note (Signed)
BP Readings from Last 3 Encounters:  11/27/23 (!) 126/100  11/11/23 (!) 151/100  09/17/23 (!) 154/106   She was switched to the combination pill losartan-HCTZ 50-12.5mg  after last office visit.  There is improvement in SBP,  but diastolic is still elevated.  P: - Will increase losartan-HCTZ to 100-25 mg.

## 2023-11-28 NOTE — Progress Notes (Signed)
Normal BMP, pt notified.

## 2023-11-28 NOTE — Assessment & Plan Note (Signed)
She has followed with neurology, with an upcoming sleep study scheduled in January 2025.

## 2023-11-28 NOTE — Assessment & Plan Note (Signed)
She is reporting 1 week history of right index finger nailbed infection, after pulling her index finger. She has noticed increased swelling, and increased warmth of the nailbed. Denies any drainage.  On my exam, right index finger nailbed with swelling, and erythema.  We discussed incision and drainage, in addition to treatment with topical mupirocin.   Patient was open to I&D in the clinic, was performed by Dr. Mikey Bussing.  She tolerated the procedure without any side effect.  -Continue to bandage the right index finger with topical mupirocin -Could also consider topical steroid for relief.

## 2023-12-02 NOTE — Progress Notes (Signed)
Internal Medicine Clinic Attending  I was physically present during the key portions of the resident provided service and participated in the medical decision making of patient's management care. I reviewed pertinent patient test results.  The assessment, diagnosis, and plan were formulated together and I agree with the documentation in the resident's note.  Incison & Drainage Procedure Note Procedure - Incision and Drainage  Patient positioned appropriately,  alcohol swab for sterilization, #11 blade scalpal used for single incision. Purulent drainage. Procedure tolerated without complications. Wound dressed with band aid.   Gust Rung, DO

## 2023-12-02 NOTE — Addendum Note (Signed)
Addended by: Gust Rung on: 12/02/2023 03:57 PM   Modules accepted: Level of Service

## 2023-12-25 ENCOUNTER — Encounter: Payer: Self-pay | Admitting: Student

## 2023-12-31 ENCOUNTER — Ambulatory Visit: Payer: Medicaid Other | Admitting: Neurology

## 2023-12-31 DIAGNOSIS — G4733 Obstructive sleep apnea (adult) (pediatric): Secondary | ICD-10-CM

## 2023-12-31 DIAGNOSIS — R718 Other abnormality of red blood cells: Secondary | ICD-10-CM

## 2023-12-31 DIAGNOSIS — G4719 Other hypersomnia: Secondary | ICD-10-CM

## 2023-12-31 DIAGNOSIS — I10 Essential (primary) hypertension: Secondary | ICD-10-CM

## 2023-12-31 DIAGNOSIS — M2624 Reverse articulation: Secondary | ICD-10-CM

## 2023-12-31 DIAGNOSIS — R351 Nocturia: Secondary | ICD-10-CM

## 2024-01-01 NOTE — Progress Notes (Signed)
[facility]  Shelley Rojas 43 year old female 03-Dec-1981    HOME SLEEP TEST REPORT ( by Watch PAT)   STUDY DATA FROM 01-01-2024     ORDERING CLINICIAN:  REFERRING CLINICIAN:  Lovie Macadamia, MD    CLINICAL INFORMATION/HISTORY: Primary HTN, Umbilical hernia,  GI. Nocturia.  Shelley Rojas is a 43 y.o. female patient who is seen upon referral on 11/11/2023 from PCP Dr Ival Bible  for a Sleep Medicine Consultation.  Chief concern according to patient :  " I have not been fatigued, I am sleepy"   I have the pleasure of seeing Shelley Rojas on 11/11/23 , who is a married mother of 4, a right-handed Caucasian female with a Nocturia sleep disorder.   Sleep relevant medical history: Nocturia 3-5 times!  Excessive daytime sleepiness , not fatigued.   Epworth sleepiness score: 14/ 24 points   FSS endorsed at 25/ 63 points.    BMI: 26 kg/m   Neck Circumference: 14.5 "   FINDINGS:   Sleep Summary:   Total Recording Time (hours, min):   8 hours 28 minutes  Total Sleep Time (hours, min):     7 hours 28 minutes            Percent REM (%):       24%                                 Respiratory Indices:   Calculated pAHI (per hour): 16.7/h                           REM pAHI:       18.6/h                                          NREM pAHI:       16.1/h                       Positional AHI:    The patient slept 248 minutes in supine with an AHI of 13.8/h with an RDI of 21.7/h.  She slept 159 minutes on her left side with an AHI of 20.9/h AHI of 28.1/h and followed by 42 minutes of right lateral sleep with an AHI of 17.2/h and RDI of 27.3/h.  There has not been a clear positional dependency to this apnea.  Snoring level reached a mean volume of 40 dB which is just the threshold of detection and  was recorded for only 3.5% of total recorded sleep time.                                               Oxygen Saturation Statistics:   Oxygen  Saturation (%) Mean: 94%              Minimum oxygen saturation (%):       81%  O2 Saturation Range (%):     Between 81% and 98%.  O2 Saturation (minutes) <89%:     0 minutes      Pulse Rate Statistics:   Pulse Mean (bpm):    69 bpm             Pulse Range: Between a minimum heart rate of 46 and a maximum heart rate of 139 bpm.  This constitutes tacky bradycardia.                IMPRESSION:  This HST confirms the presence of mild to moderate all obstructive sleep apnea no central events were identified by this home sleep test device.  Snoring was not detected, no hypoxemia was seen.  This apnea is neither REM sleep dependent no positional dependent and could be treated with positive airway pressure, a dental device, and inspire device.   RECOMMENDATION: Since the patient's sleep concern manifests as excessive daytime sleepiness I would first treat with CPAP and reevaluate her sleepiness after 90 days of therapy, compliance will mean 4 hours or more or nightly use. Having established the diagnosis of obstructive sleep apnea also allows for the prescription of medication that increases alertness in daytime.  Dependent on the patient's preference I would start a therapy with positive airway pressure which hopefully will reduce the nocturia, the excessive daytime sleepiness, and we can optionally add modafinil or armodafinil as oral medications to increase safety while driving, operating machinery, etc.   I will prepare a durable medical equipment prescription but await the patient's verbal agreement to start therapy.    INTERPRETING PHYSICIAN:   Melvyn Novas, MD

## 2024-01-07 ENCOUNTER — Encounter: Payer: Self-pay | Admitting: Neurology

## 2024-01-07 DIAGNOSIS — G4733 Obstructive sleep apnea (adult) (pediatric): Secondary | ICD-10-CM | POA: Insufficient documentation

## 2024-01-07 NOTE — Procedures (Signed)
[facility]  Hana Trippett Cannaday 43 year old female Nov 18, 1981    HOME SLEEP TEST REPORT ( by Watch PAT)   STUDY DATA FROM 01-01-2024     ORDERING CLINICIAN:  REFERRING CLINICIAN:  Lovie Macadamia, MD    CLINICAL INFORMATION/HISTORY: Primary HTN, Umbilical hernia,  GI. Nocturia.  Shelley Rojas is a 43 y.o. female patient who is seen upon referral on 11/11/2023 from PCP Dr Ival Bible  for a Sleep Medicine Consultation.  Chief concern according to patient :  " I have not been fatigued, I am sleepy"   I have the pleasure of seeing Shelley Rojas on 11/11/23 , who is a married mother of 4, a right-handed Caucasian female with a Nocturia sleep disorder.   Sleep relevant medical history: Nocturia 3-5 times!  Excessive daytime sleepiness , not fatigued.   Epworth sleepiness score: 14/ 24 points   FSS endorsed at 25/ 63 points.    BMI: 26 kg/m   Neck Circumference: 14.5 "   FINDINGS:   Sleep Summary:   Total Recording Time (hours, min):   8 hours 28 minutes  Total Sleep Time (hours, min):     7 hours 28 minutes            Percent REM (%):       24%                                 Respiratory Indices:   Calculated pAHI (per hour): 16.7/h                           REM pAHI:       18.6/h                                          NREM pAHI:       16.1/h                       Positional AHI:    The patient slept 248 minutes in supine with an AHI of 13.8/h with an RDI of 21.7/h.  She slept 159 minutes on her left side with an AHI of 20.9/h AHI of 28.1/h and followed by 42 minutes of right lateral sleep with an AHI of 17.2/h and RDI of 27.3/h.  There has not been a clear positional dependency to this apnea.  Snoring level reached a mean volume of 40 dB which is just the threshold of detection and  was recorded for only 3.5% of total recorded sleep time.                                               Oxygen Saturation Statistics:   Oxygen Saturation  (%) Mean: 94%              Minimum oxygen saturation (%):       81%  O2 Saturation Range (%):     Between 81% and 98%.  O2 Saturation (minutes) <89%:     0 minutes      Pulse Rate Statistics:   Pulse Mean (bpm):    69 bpm             Pulse Range: Between a minimum heart rate of 46 and a maximum heart rate of 139 bpm.  This constitutes tacky bradycardia.                IMPRESSION:  This HST confirms the presence of mild to moderate all - obstructive sleep apnea .  No central apnea events were identified by this home sleep test device.   Snoring was not detected, no hypoxemia was seen.  T his apnea is neither REM sleep dependent no positional dependent and could be treated with positive airway pressure, a dental device, and inspire device. The elevated hematocrit was unexplained by these findings., lacking  clinically significant hypoxemia .    RECOMMENDATION: Since the patient's sleep concern manifests as excessive daytime sleepiness  and nocturia , I would first treat with CPAP and reevaluate her sleepiness after 90 days of therapy, compliance will mean 4 hours or more or nightly use. Having established the diagnosis of obstructive sleep apnea also allows for the prescription of medication that increases alertness in daytime.  Dependent on the patient's preference I would start a therapy with positive airway pressure which hopefully will reduce the nocturia, the excessive daytime sleepiness, and we can optionally add modafinil or armodafinil as oral medications to increase safety while driving, operating machinery, etc.   I will prepare a durable medical equipment prescription but await the patient's verbal agreement to start therapy.    INTERPRETING PHYSICIAN:   Melvyn Novas, MD

## 2024-01-09 ENCOUNTER — Ambulatory Visit: Payer: Self-pay | Admitting: General Surgery

## 2024-01-14 ENCOUNTER — Telehealth: Payer: Self-pay | Admitting: *Deleted

## 2024-01-14 NOTE — Telephone Encounter (Signed)
-----   Message from [place] Dohmeier sent at 01/07/2024  6:07 PM EST ----- This HST confirms the presence of mild to moderate all - obstructive sleep apnea .  No central apnea events were identified by this home sleep test device.   Snoring was not detected, no hypoxemia was seen.  T his apnea is neither REM sleep dependent no positional dependent and could be treated with positive airway pressure, a dental device, and inspire device. The elevated hematocrit was unexplained by these findings., lacking  clinically significant hypoxemia .  Dependent on the patient's preference I would start a therapy with positive airway pressure which hopefully will reduce the nocturia, the excessive daytime sleepiness, and we can optionally add modafinil or armodafinil as oral medications to increase safety while driving, operating machinery, etc.    I will prepare a durable medical equipment prescription but await the patient's verbal agreement to start therapy.

## 2024-01-14 NOTE — Telephone Encounter (Signed)
 Spoke to pt gave sleep study results Pt chose [employer] [facility] for DME  Gave pt [employer] # Pt  informed of insurance compliance Made pt f/u with Jessica,NP 03/2024 Pt expressed understanding and thanked me for calling. Sent new orders to [employer] [facility] this afternoon

## 2024-01-28 ENCOUNTER — Other Ambulatory Visit: Payer: Self-pay | Admitting: Student

## 2024-01-28 ENCOUNTER — Ambulatory Visit: Payer: Medicaid Other | Admitting: Student

## 2024-01-28 VITALS — BP 137/92 | HR 78 | Temp 98.6°F | Ht 69.0 in | Wt 171.1 lb

## 2024-01-28 DIAGNOSIS — G4733 Obstructive sleep apnea (adult) (pediatric): Secondary | ICD-10-CM | POA: Diagnosis not present

## 2024-01-28 DIAGNOSIS — I10 Essential (primary) hypertension: Secondary | ICD-10-CM

## 2024-01-28 MED ORDER — AMLODIPINE BESYLATE 5 MG PO TABS: 5.0000 mg | ORAL_TABLET | Freq: Every day | ORAL | 1 refills | Status: DC

## 2024-01-28 NOTE — Progress Notes (Unsigned)
   CC: Routine Follow Up for hypertension after last office visit 11/27/2023  HPI:  Shelley Rojas is a 43 y.o. female with pertinent PMH of HTN, OSA, HLD, and GERD who presents as above. Please see assessment and plan below for further details.  Review of Systems:   Pertinent items noted in HPI and/or A&P.  Physical Exam:  Vitals:   01/28/24 1034  BP: (!) 137/92  Pulse: 78  Temp: 98.6 F (37 C)  TempSrc: Oral  SpO2: 99%  Weight: 171 lb 1.6 oz (77.6 kg)  Height: 5\' 9"  (1.753 m)    Constitutional: Well-appearing adult female. In no acute distress. HEENT: Normocephalic, atraumatic, Sclera non-icteric, PERRL, EOM intact Cardio:Regular rate and rhythm. 2+ bilateral radial pulses. Pulm:Clear to auscultation bilaterally. Normal work of breathing on room air. Abdomen: Soft, non-tender, non-distended, positive bowel sounds. ZOX:[id number] for extremity edema. Skin:Warm and dry. Neuro:Alert and oriented x3. No focal deficit noted. Psych:Pleasant mood and affect.   Assessment & Plan:   Hypertension With pressure today slightly elevated 137/92 after losartan/chlorothiazide was increased from 50/12.5-1 100/50 mg daily at previous visit.  Blood pressures at home have been similar with systolics primarily in the 120s and diastolics up to 95.  Patient has previously been on amlodipine without side effects. - Start amlodipine 5 mg daily and continue losartan/hydrochlorothiazide 100/25 mg daily, if this is efficacious there is a combo pill of amlodipine, olmesartan, and hydrochlorothiazide -BMP today  Moderate obstructive sleep apnea-hypopnea syndrome Patient has completed her sleep study and has a follow-up with neurology in the next month.  She has been hesitant to start with the CPAP machine but after further discussion of the risks, benefits, and alternatives she is willing to give it a try.  Discussed impact on blood pressure, heart failure, pulmonary hypertension, fatigue.     Patient discussed with Dr. Julieanne Cotton, DO [facility] Internal Medicine Resident PGY-2 Clinic Phone: [phone] Pager: [phone]

## 2024-01-28 NOTE — Patient Instructions (Signed)
  Thank you, Ms.Smitty Cords, for allowing [place] to provide your care today.  Since her blood pressure is still a little bit higher than our goal we are going to start a medication called amlodipine at 5 mg daily.  The most common adverse effect of this medication is leg swelling.  Please let us know if this occurs.  Please keep a blood pressure log at home and we will review this at your next visit in about 3 months.  I am glad that you we will give the CPAP a try and I think you will make a positive impact on your life and your long-term health.  If your blood pressures are getting higher or anything else is bothering you please do not hesitate to call us sooner.   I have ordered the following labs for you:   Lab Orders         BMP8+Anion Gap       I have ordered the following medication/changed the following medications:   Stop the following medications: There are no discontinued medications.   Start the following medications: Meds ordered this encounter  Medications   amLODipine (NORVASC) 5 MG tablet    Sig: Take 1 tablet (5 mg total) by mouth daily.    Dispense:  30 tablet    Refill:  1      Follow up: 3 months    Remember:     Should you have any questions or concerns please call the internal medicine clinic at [phone].     Rocky Morel, DO [facility]

## 2024-01-29 ENCOUNTER — Telehealth: Payer: Self-pay | Admitting: *Deleted

## 2024-01-29 LAB — BMP8+ANION GAP
Anion Gap: 15 mmol/L (ref 10.0–18.0)
BUN/Creatinine Ratio: 13 (ref 9–23)
BUN: 11 mg/dL (ref 6–24)
CO2: 21 mmol/L (ref 20–29)
Calcium: 9.3 mg/dL (ref 8.7–10.2)
Chloride: 101 mmol/L (ref 96–106)
Creatinine, Ser: 0.82 mg/dL (ref 0.57–1.00)
Glucose: 89 mg/dL (ref 70–99)
Potassium: 4.4 mmol/L (ref 3.5–5.2)
Sodium: 137 mmol/L (ref 134–144)
eGFR: 92 mL/min/{1.73_m2} (ref >59)

## 2024-01-29 NOTE — Assessment & Plan Note (Addendum)
With pressure today slightly elevated 137/92 after losartan/chlorothiazide was increased from 50/12.5-1 100/50 mg daily at previous visit.  Blood pressures at home have been similar with systolics primarily in the 120s and diastolics up to 95.  Patient has previously been on amlodipine without side effects. - Start amlodipine 5 mg daily and continue losartan/hydrochlorothiazide 100/25 mg daily, if this is efficacious there is a combo pill of amlodipine, olmesartan, and hydrochlorothiazide -BMP today

## 2024-01-29 NOTE — Telephone Encounter (Signed)
 Pharmacy requesting a 90 day supply  Medication sent to pharmacy.

## 2024-01-29 NOTE — Progress Notes (Signed)
 Internal Medicine Clinic Attending  Case discussed with the resident physician at the time of the visit.  We reviewed the patient's history, exam, and pertinent patient test results.  I agree with the assessment, diagnosis, and plan of care documented in the resident's note.

## 2024-01-29 NOTE — Telephone Encounter (Signed)
Called patient left voice message regarding mammogram / asking patient to return call that mammogram needs to be schedule no later then July 2025. Patient asked to call [phone] breast center to schedule.

## 2024-01-29 NOTE — Assessment & Plan Note (Signed)
Patient has completed her sleep study and has a follow-up with neurology in the next month.  She has been hesitant to start with the CPAP machine but after further discussion of the risks, benefits, and alternatives she is willing to give it a try.  Discussed impact on blood pressure, heart failure, pulmonary hypertension, fatigue.

## 2024-02-07 NOTE — Pre-Procedure Instructions (Signed)
 Surgical Instructions   Your procedure is scheduled on February 17, 2024. Report to [facility] Main Entrance "A" at 9:30 A.M., then check in with the Admitting office. Any questions or running late day of surgery: call [phone]  Questions prior to your surgery date: call [phone], Monday-Friday, 8am-4pm. If you experience any cold or flu symptoms such as cough, fever, chills, shortness of breath, etc. between now and your scheduled surgery, please notify us at the above number.     Remember:  Do not eat after midnight the night before your surgery   You may drink clear liquids until 8:30 AM the morning of your surgery.   Clear liquids allowed are: Water, Non-Citrus Juices (without pulp), Carbonated Beverages, Clear Tea (no milk, honey, etc.), Black Coffee Only (NO MILK, CREAM OR POWDERED CREAMER of any kind), and Gatorade.    Take these medicines the morning of surgery with A SIP OF WATER: amLODipine (NORVASC)  atorvastatin (LIPITOR)    One week prior to surgery, STOP taking any Aspirin (unless otherwise instructed by your surgeon) Aleve, Naproxen, Ibuprofen, Motrin, Advil, Goody's, BC's, all herbal medications, fish oil, and non-prescription vitamins.                     Do NOT Smoke (Tobacco/Vaping) for 24 hours prior to your procedure.  If you use a CPAP at night, you may bring your mask/headgear for your overnight stay.   You will be asked to remove any contacts, glasses, piercing's, hearing aid's, dentures/partials prior to surgery. Please bring cases for these items if needed.    Patients discharged the day of surgery will not be allowed to drive home, and someone needs to stay with them for 24 hours.  SURGICAL WAITING ROOM VISITATION Patients may have no more than 2 support people in the waiting area - these visitors may rotate.   Pre-op nurse will coordinate an appropriate time for 1 ADULT support person, who may not rotate, to accompany patient in pre-op.  Children  under the age of 49 must have an adult with them who is not the patient and must remain in the main waiting area with an adult.  If the patient needs to stay at the hospital during part of their recovery, the visitor guidelines for inpatient rooms apply.  Please refer to the [facility] website for the visitor guidelines for any additional information.   If you received a COVID test during your pre-op visit  it is requested that you wear a mask when out in public, stay away from anyone that may not be feeling well and notify your surgeon if you develop symptoms. If you have been in contact with anyone that has tested positive in the last 10 days please notify you surgeon.      Pre-operative CHG Bathing Instructions   You can play a key role in reducing the risk of infection after surgery. Your skin needs to be as free of germs as possible. You can reduce the number of germs on your skin by washing with CHG (chlorhexidine gluconate) soap before surgery. CHG is an antiseptic soap that kills germs and continues to kill germs even after washing.   DO NOT use if you have an allergy to chlorhexidine/CHG or antibacterial soaps. If your skin becomes reddened or irritated, stop using the CHG and notify one of our RNs at [phone].              TAKE A SHOWER THE NIGHT BEFORE SURGERY  AND THE DAY OF SURGERY    Please keep in mind the following:  DO NOT shave, including legs and underarms, 48 hours prior to surgery.   You may shave your face before/day of surgery.  Place clean sheets on your bed the night before surgery Use a clean washcloth (not used since being washed) for each shower. DO NOT sleep with pet's night before surgery.  CHG Shower Instructions:  Wash your face and private area with normal soap. If you choose to wash your hair, wash first with your normal shampoo.  After you use shampoo/soap, rinse your hair and body thoroughly to remove shampoo/soap residue.  Turn the water OFF and  apply half the bottle of CHG soap to a CLEAN washcloth.  Apply CHG soap ONLY FROM YOUR NECK DOWN TO YOUR TOES (washing for 3-5 minutes)  DO NOT use CHG soap on face, private areas, open wounds, or sores.  Pay special attention to the area where your surgery is being performed.  If you are having back surgery, having someone wash your back for you may be helpful. Wait 2 minutes after CHG soap is applied, then you may rinse off the CHG soap.  Pat dry with a clean towel  Put on clean pajamas    Additional instructions for the day of surgery: DO NOT APPLY any lotions, deodorants, cologne, or perfumes.   Do not wear jewelry or makeup Do not wear nail polish, gel polish, artificial nails, or any other type of covering on natural nails (fingers and toes) Do not bring valuables to the hospital. [facility] is not responsible for valuables/personal belongings. Put on clean/comfortable clothes.  Please brush your teeth.  Ask your nurse before applying any prescription medications to the skin.

## 2024-02-10 ENCOUNTER — Other Ambulatory Visit: Payer: Self-pay

## 2024-02-10 ENCOUNTER — Encounter (HOSPITAL_COMMUNITY)
Admission: RE | Admit: 2024-02-10 | Discharge: 2024-02-10 | Disposition: A | Discharge status: Home / Self Care | Payer: Medicaid Other | Source: Ambulatory Visit | Attending: General Surgery | Admitting: General Surgery

## 2024-02-10 ENCOUNTER — Encounter (HOSPITAL_COMMUNITY): Payer: Self-pay

## 2024-02-10 VITALS — BP 133/91 | HR 64 | Temp 98.1°F | Resp 17 | Ht 69.0 in | Wt 168.9 lb

## 2024-02-10 DIAGNOSIS — Z01812 Encounter for preprocedural laboratory examination: Secondary | ICD-10-CM | POA: Insufficient documentation

## 2024-02-10 DIAGNOSIS — Z01818 Encounter for other preprocedural examination: Secondary | ICD-10-CM

## 2024-02-10 HISTORY — DX: Sleep apnea, unspecified: G47.30

## 2024-02-10 LAB — CBC
HCT: 44.4 % (ref 36.0–46.0)
Hemoglobin: 14.7 g/dL (ref 12.0–15.0)
MCH: 31.2 pg (ref 26.0–34.0)
MCHC: 33.1 g/dL (ref 30.0–36.0)
MCV: 94.3 fL (ref 80.0–100.0)
Platelets: 203 10*3/uL (ref 150–400)
RBC: 4.71 MIL/uL (ref 3.87–5.11)
RDW: 13.2 % (ref 11.5–15.5)
WBC: 4.6 10*3/uL (ref 4.0–10.5)
nRBC: 0 % (ref 0.0–0.2)

## 2024-02-10 NOTE — Progress Notes (Signed)
 PCP - Dr. Lovie Macadamia Cardiologist - Denies  PPM/ICD - Denies Device Orders - n/a Rep Notified - n/a  Chest x-ray - n/a EKG - 06/19/2023 Stress Test - Denies ECHO - Denies Cardiac Cath - Denies  Sleep Study - Pt recently tested +OSA, but has not received her CPAP yet.  No DM  Last dose of GLP1 agonist- n/a GLP1 instructions: n/a  Blood Thinner Instructions: n/a Aspirin Instructions: n/a  ERAS Protcol - Clear liquids until 0830 morning of surgery PRE-SURGERY Ensure or G2- n/a  COVID TEST- n/a   Anesthesia review: No.   Patient denies shortness of breath, fever, cough and chest pain at PAT appointment. Pt denies any respiratory illness/infection in the last two months.   All instructions explained to the patient, with a verbal understanding of the material. Patient agrees to go over the instructions while at home for a better understanding. Patient also instructed to self quarantine after being tested for COVID-19. The opportunity to ask questions was provided.

## 2024-02-17 ENCOUNTER — Ambulatory Visit (HOSPITAL_COMMUNITY): Payer: Self-pay | Admitting: Anesthesiology

## 2024-02-17 ENCOUNTER — Other Ambulatory Visit: Payer: Self-pay

## 2024-02-17 ENCOUNTER — Encounter (HOSPITAL_COMMUNITY): Payer: Self-pay | Admitting: General Surgery

## 2024-02-17 ENCOUNTER — Ambulatory Visit (HOSPITAL_BASED_OUTPATIENT_CLINIC_OR_DEPARTMENT_OTHER): Payer: Self-pay | Admitting: Anesthesiology

## 2024-02-17 ENCOUNTER — Telehealth: Payer: Self-pay | Admitting: *Deleted

## 2024-02-17 ENCOUNTER — Encounter (HOSPITAL_COMMUNITY)
Admission: RE | Disposition: A | Discharge status: Home / Self Care | Payer: Self-pay | Source: Home / Self Care | Attending: General Surgery

## 2024-02-17 ENCOUNTER — Ambulatory Visit (HOSPITAL_COMMUNITY)
Admission: RE | Admit: 2024-02-17 | Discharge: 2024-02-17 | Disposition: A | Discharge status: Home / Self Care | Payer: Medicaid Other | Attending: General Surgery | Admitting: General Surgery

## 2024-02-17 DIAGNOSIS — G4730 Sleep apnea, unspecified: Secondary | ICD-10-CM | POA: Insufficient documentation

## 2024-02-17 DIAGNOSIS — I10 Essential (primary) hypertension: Secondary | ICD-10-CM | POA: Diagnosis not present

## 2024-02-17 DIAGNOSIS — K429 Umbilical hernia without obstruction or gangrene: Secondary | ICD-10-CM | POA: Insufficient documentation

## 2024-02-17 DIAGNOSIS — F17290 Nicotine dependence, other tobacco product, uncomplicated: Secondary | ICD-10-CM | POA: Insufficient documentation

## 2024-02-17 DIAGNOSIS — Z01818 Encounter for other preprocedural examination: Secondary | ICD-10-CM

## 2024-02-17 LAB — POCT PREGNANCY, URINE: Preg Test, Ur: NEGATIVE

## 2024-02-17 SURGERY — REPAIR, HERNIA, UMBILICAL, ADULT
Anesthesia: General

## 2024-02-17 MED ORDER — MIDAZOLAM HCL 2 MG/2ML IJ SOLN
INTRAMUSCULAR | Status: DC | PRN
  Administered 2024-02-17: 2 mg via INTRAVENOUS

## 2024-02-17 MED ORDER — OXYCODONE HCL 5 MG PO TABS
ORAL_TABLET | ORAL | Status: AC
  Filled 2024-02-17: qty 1

## 2024-02-17 MED ORDER — OXYCODONE HCL 5 MG PO TABS: 5.0000 mg | ORAL_TABLET | Freq: Four times a day (QID) | ORAL | 0 refills | Status: AC | PRN

## 2024-02-17 MED ORDER — MIDAZOLAM HCL 2 MG/2ML IJ SOLN
INTRAMUSCULAR | Status: AC
  Filled 2024-02-17: qty 2

## 2024-02-17 MED ORDER — OXYCODONE HCL 5 MG/5ML PO SOLN: 5.0000 mg | Freq: Once | ORAL | Status: AC | PRN

## 2024-02-17 MED ORDER — CHLORHEXIDINE GLUCONATE CLOTH 2 % EX PADS: 6.0000 | MEDICATED_PAD | Freq: Once | CUTANEOUS | Status: DC

## 2024-02-17 MED ORDER — ONDANSETRON HCL 4 MG/2ML IJ SOLN: 4.0000 mg | Freq: Once | INTRAMUSCULAR | Status: DC | PRN

## 2024-02-17 MED ORDER — LIDOCAINE 2% (20 MG/ML) 5 ML SYRINGE
INTRAMUSCULAR | Status: DC | PRN
  Administered 2024-02-17: 100 mg via INTRAVENOUS

## 2024-02-17 MED ORDER — LACTATED RINGERS IV SOLN: INTRAVENOUS | Status: DC

## 2024-02-17 MED ORDER — CHLORHEXIDINE GLUCONATE 0.12 % MT SOLN
OROMUCOSAL | Status: AC
  Administered 2024-02-17: 15 mL via OROMUCOSAL
  Filled 2024-02-17: qty 15

## 2024-02-17 MED ORDER — KETOROLAC TROMETHAMINE 30 MG/ML IJ SOLN
INTRAMUSCULAR | Status: DC | PRN
  Administered 2024-02-17: 30 mg via INTRAVENOUS

## 2024-02-17 MED ORDER — CEFAZOLIN SODIUM-DEXTROSE 2-4 GM/100ML-% IV SOLN
INTRAVENOUS | Status: AC
  Filled 2024-02-17: qty 100

## 2024-02-17 MED ORDER — PROPOFOL 10 MG/ML IV BOLUS
INTRAVENOUS | Status: AC
  Filled 2024-02-17: qty 20

## 2024-02-17 MED ORDER — ONDANSETRON HCL 4 MG/2ML IJ SOLN
INTRAMUSCULAR | Status: DC | PRN
  Administered 2024-02-17: 4 mg via INTRAVENOUS

## 2024-02-17 MED ORDER — FENTANYL CITRATE (PF) 250 MCG/5ML IJ SOLN
INTRAMUSCULAR | Status: DC | PRN
  Administered 2024-02-17: 100 ug via INTRAVENOUS

## 2024-02-17 MED ORDER — DEXAMETHASONE SODIUM PHOSPHATE 10 MG/ML IJ SOLN
INTRAMUSCULAR | Status: DC | PRN
  Administered 2024-02-17: 10 mg via INTRAVENOUS

## 2024-02-17 MED ORDER — 0.9 % SODIUM CHLORIDE (POUR BTL) OPTIME
TOPICAL | Status: DC | PRN
  Administered 2024-02-17: 1000 mL

## 2024-02-17 MED ORDER — ROCURONIUM BROMIDE 10 MG/ML (PF) SYRINGE
PREFILLED_SYRINGE | INTRAVENOUS | Status: DC | PRN
  Administered 2024-02-17: 60 mg via INTRAVENOUS

## 2024-02-17 MED ORDER — GABAPENTIN 100 MG PO CAPS: 100.0000 mg | ORAL_CAPSULE | ORAL | Status: AC

## 2024-02-17 MED ORDER — BUPIVACAINE-EPINEPHRINE (PF) 0.25% -1:200000 IJ SOLN
INTRAMUSCULAR | Status: AC
  Filled 2024-02-17: qty 30

## 2024-02-17 MED ORDER — OXYCODONE HCL 5 MG PO TABS
5.0000 mg | ORAL_TABLET | Freq: Once | ORAL | Status: AC | PRN
  Administered 2024-02-17: 5 mg via ORAL

## 2024-02-17 MED ORDER — CHLORHEXIDINE GLUCONATE 0.12 % MT SOLN: 15.0000 mL | Freq: Once | OROMUCOSAL | Status: AC

## 2024-02-17 MED ORDER — PHENYLEPHRINE 80 MCG/ML (10ML) SYRINGE FOR IV PUSH (FOR BLOOD PRESSURE SUPPORT)
PREFILLED_SYRINGE | INTRAVENOUS | Status: DC | PRN
  Administered 2024-02-17: 160 ug via INTRAVENOUS
  Administered 2024-02-17: 80 ug via INTRAVENOUS

## 2024-02-17 MED ORDER — ORAL CARE MOUTH RINSE: 15.0000 mL | Freq: Once | OROMUCOSAL | Status: AC

## 2024-02-17 MED ORDER — FENTANYL CITRATE (PF) 100 MCG/2ML IJ SOLN
INTRAMUSCULAR | Status: DC
Start: 2024-02-17 — End: 2024-02-17
  Filled 2024-02-17: qty 2

## 2024-02-17 MED ORDER — FENTANYL CITRATE (PF) 100 MCG/2ML IJ SOLN
25.0000 ug | INTRAMUSCULAR | Status: DC | PRN
Start: 2024-02-17 — End: 2024-02-17
  Administered 2024-02-17 (×2): 25 ug via INTRAVENOUS

## 2024-02-17 MED ORDER — ACETAMINOPHEN 500 MG PO TABS
ORAL_TABLET | ORAL | Status: AC
  Administered 2024-02-17: 1000 mg via ORAL
  Filled 2024-02-17: qty 2

## 2024-02-17 MED ORDER — PROPOFOL 10 MG/ML IV BOLUS
INTRAVENOUS | Status: DC | PRN
  Administered 2024-02-17: 180 mg via INTRAVENOUS

## 2024-02-17 MED ORDER — LIDOCAINE 2% (20 MG/ML) 5 ML SYRINGE
INTRAMUSCULAR | Status: AC
  Filled 2024-02-17: qty 5

## 2024-02-17 MED ORDER — ACETAMINOPHEN 500 MG PO TABS: 1000.0000 mg | ORAL_TABLET | ORAL | Status: AC

## 2024-02-17 MED ORDER — FENTANYL CITRATE (PF) 100 MCG/2ML IJ SOLN
INTRAMUSCULAR | Status: AC
  Filled 2024-02-17: qty 2

## 2024-02-17 MED ORDER — CEFAZOLIN SODIUM-DEXTROSE 2-4 GM/100ML-% IV SOLN
2.0000 g | INTRAVENOUS | Status: AC
  Administered 2024-02-17: 2 g via INTRAVENOUS

## 2024-02-17 MED ORDER — SUGAMMADEX SODIUM 200 MG/2ML IV SOLN
INTRAVENOUS | Status: DC | PRN
  Administered 2024-02-17: 200 mg via INTRAVENOUS

## 2024-02-17 MED ORDER — BUPIVACAINE-EPINEPHRINE 0.25% -1:200000 IJ SOLN
INTRAMUSCULAR | Status: DC | PRN
  Administered 2024-02-17: 10 mL

## 2024-02-17 MED ORDER — ACETAMINOPHEN 10 MG/ML IV SOLN: 1000.0000 mg | Freq: Once | INTRAVENOUS | Status: DC | PRN

## 2024-02-17 MED ORDER — GABAPENTIN 100 MG PO CAPS
ORAL_CAPSULE | ORAL | Status: AC
  Administered 2024-02-17: 100 mg via ORAL
  Filled 2024-02-17: qty 1

## 2024-02-17 SURGICAL SUPPLY — 26 items
BAG COUNTER SPONGE SURGICOUNT (BAG) ×1 IMPLANT
BLADE CLIPPER SURG (BLADE) IMPLANT
CHLORAPREP W/TINT 26 (MISCELLANEOUS) ×1 IMPLANT
COVER SURGICAL LIGHT HANDLE (MISCELLANEOUS) ×1 IMPLANT
DERMABOND ADVANCED .7 DNX12 (GAUZE/BANDAGES/DRESSINGS) ×1 IMPLANT
DRAPE LAPAROSCOPIC ABDOMINAL (DRAPES) ×1 IMPLANT
ELECT REM PT RETURN 9FT ADLT (ELECTROSURGICAL) ×1 IMPLANT
ELECTRODE REM PT RTRN 9FT ADLT (ELECTROSURGICAL) ×1 IMPLANT
GAUZE SPONGE 4X4 12PLY STRL (GAUZE/BANDAGES/DRESSINGS) IMPLANT
GLOVE BIO SURGEON STRL SZ7.5 (GLOVE) ×1 IMPLANT
GOWN STRL REUS W/ TWL LRG LVL3 (GOWN DISPOSABLE) ×2 IMPLANT
KIT BASIN OR (CUSTOM PROCEDURE TRAY) ×1 IMPLANT
KIT TURNOVER KIT B (KITS) ×1 IMPLANT
NDL HYPO 25GX1X1/2 BEV (NEEDLE) ×1 IMPLANT
NEEDLE HYPO 25GX1X1/2 BEV (NEEDLE) ×1 IMPLANT
NS IRRIG 1000ML POUR BTL (IV SOLUTION) ×1 IMPLANT
PACK GENERAL/GYN (CUSTOM PROCEDURE TRAY) ×1 IMPLANT
PAD ARMBOARD 7.5X6 YLW CONV (MISCELLANEOUS) ×1 IMPLANT
PENCIL SMOKE EVACUATOR (MISCELLANEOUS) ×1 IMPLANT
SUT MNCRL AB 4-0 PS2 18 (SUTURE) ×1 IMPLANT
SUT NOVA NAB DX-16 0-1 5-0 T12 (SUTURE) ×1 IMPLANT
SUT VIC AB 2-0 SH 27X BRD (SUTURE) ×1 IMPLANT
SYR CONTROL 10ML LL (SYRINGE) ×1 IMPLANT
TAPE CLOTH SURG 4X10 WHT LF (GAUZE/BANDAGES/DRESSINGS) IMPLANT
TOWEL GREEN STERILE (TOWEL DISPOSABLE) ×1 IMPLANT
TOWEL GREEN STERILE FF (TOWEL DISPOSABLE) ×1 IMPLANT

## 2024-02-17 NOTE — Anesthesia Procedure Notes (Signed)
 Procedure Name: Intubation Date/Time: 02/17/2024 10:20 AM  Performed by: Mariann Barter, MDPre-anesthesia Checklist: Patient identified, Emergency Drugs available, Suction available and Patient being monitored Patient Re-evaluated:Patient Re-evaluated prior to induction Oxygen Delivery Method: Circle System Utilized Preoxygenation: Pre-oxygenation with 100% oxygen Induction Type: IV induction Ventilation: Mask ventilation without difficulty Laryngoscope Size: Mac and 3 Grade View: Grade I Tube type: Oral Tube size: 7.0 mm Number of attempts: 2 Airway Equipment and Method: Stylet and Oral airway Placement Confirmation: ETT inserted through vocal cords under direct vision, positive ETCO2 and breath sounds checked- equal and bilateral Tube secured with: Tape Dental Injury: Teeth and Oropharynx as per pre-operative assessment  Comments: Initial laryngoscopy by Dr. Deneise Lever, pediatric critical care resident physician; no adequate view obtained, no attempt made at intubation. Second look by Ace Gins, MD, attending anesthesiologist.

## 2024-02-17 NOTE — Discharge Instructions (Addendum)
 Post Anesthesia Home Care Instructions  Activity: Get plenty of rest for the remainder of the day. A responsible individual must stay with you for 24 hours following the procedure.  For the next 24 hours, DO NOT: -Drive a car -Advertising copywriter -Drink alcoholic beverages -Take any medication unless instructed by your physician -Make any legal decisions or sign important papers.  Meals: Start with liquid foods such as gelatin or soup. Progress to regular foods as tolerated. Avoid greasy, spicy, heavy foods. If nausea and/or vomiting occur, drink only clear liquids until the nausea and/or vomiting subsides. Call your physician if vomiting continues.  Special Instructions/Symptoms: Your throat may feel dry or sore from the anesthesia or the breathing tube placed in your throat during surgery. If this causes discomfort, gargle with warm salt water. The discomfort should disappear within 24 hours.  If you had a scopolamine patch placed behind your ear for the management of post- operative nausea and/or vomiting:  1. The medication in the patch is effective for 72 hours, after which it should be removed.  Wrap patch in a tissue and discard in the trash. Wash hands thoroughly with soap and water. 2. You may remove the patch earlier than 72 hours if you experience unpleasant side effects which may include dry mouth, dizziness or visual disturbances. 3. Avoid touching the patch. Wash your hands with soap and water after contact with the patch.Post Anesthesia Home Care Instructions  Activity: Get plenty of rest for the remainder of the day. A responsible individual must stay with you for 24 hours following the procedure.  For the next 24 hours, DO NOT: -Drive a car -Advertising copywriter -Drink alcoholic beverages -Take any medication unless instructed by your physician -Make any legal decisions or sign important papers.  Meals: Start with liquid foods such as gelatin or soup. Progress to regular  foods as tolerated. Avoid greasy, spicy, heavy foods. If nausea and/or vomiting occur, drink only clear liquids until the nausea and/or vomiting subsides. Call your physician if vomiting continues.  Special Instructions/Symptoms: Your throat may feel dry or sore from the anesthesia or the breathing tube placed in your throat during surgery. If this causes discomfort, gargle with warm salt water. The discomfort should disappear within 24 hours.    YOU MAY TAKE TYLENOL AT 4 PM TODAY IF NEEDED  YOU MAY TAKE IBUPROFEN AT 5 PM TODAY IF NEEDED  YOU HAD OXYCODONE 5MG  IMMEDIATE RELEASE AT 1155 TODAY

## 2024-02-17 NOTE — Transfer of Care (Signed)
 Immediate Anesthesia Transfer of Care Note  Patient: Shelley Rojas  Procedure(s) Performed: UMBILICAL HERNIA REPAIR  Patient Location: PACU  Anesthesia Type:General  Level of Consciousness: awake, alert , and oriented  Airway & Oxygen Therapy: Patient Spontanous Breathing  Post-op Assessment: Report given to RN and Post -op Vital signs reviewed and stable  Post vital signs: Reviewed and stable  Last Vitals:  Vitals Value Taken Time  BP 124/93 02/17/24 1113  Temp    Pulse 60 02/17/24 1114  Resp 17 02/17/24 1114  SpO2 99 % 02/17/24 1114  Vitals shown include unfiled device data.  Last Pain:  Vitals:   02/17/24 0944  TempSrc:   PainSc: 0-No pain         Complications: No notable events documented.

## 2024-02-17 NOTE — Interval H&P Note (Signed)
 History and Physical Interval Note:  02/17/2024 9:21 AM  Shelley Rojas  has presented today for surgery, with the diagnosis of Umbilical Hernia.  The various methods of treatment have been discussed with the patient and family. After consideration of risks, benefits and other options for treatment, the patient has consented to  Procedure(s): UMBILICAL HERNIA REPAIR WITH MESH (N/A) as a surgical intervention.  The patient's history has been reviewed, patient examined, no change in status, stable for surgery.  I have reviewed the patient's chart and labs.  Questions were answered to the patient's satisfaction.     [name]

## 2024-02-17 NOTE — Op Note (Signed)
 02/17/2024  12:40 PM  PATIENT:  Shelley Rojas  43 y.o. female  PRE-OPERATIVE DIAGNOSIS:  Umbilical Hernia  POST-OPERATIVE DIAGNOSIS:  Umbilical Hernia  PROCEDURE:  Procedure(s): UMBILICAL HERNIA REPAIR (N/A)  SURGEON:  Surgeons and Role:    * Griselda Miner, MD - Primary  PHYSICIAN ASSISTANT:   ASSISTANTS: none   ANESTHESIA:   local and general  EBL:  20 mL   BLOOD ADMINISTERED:none  DRAINS: none   LOCAL MEDICATIONS USED:  MARCAINE     SPECIMEN:  No Specimen  DISPOSITION OF SPECIMEN:  N/A  COUNTS:  YES  TOURNIQUET:  * No tourniquets in log *  DICTATION: .Dragon Dictation  After informed consent was obtained the patient was brought to the operating room and placed in the supine position on the operating table.  After adequate induction of general anesthesia the patient's abdomen was prepped with ChloraPrep, allowed to dry, and draped in usual sterile manner.  An appropriate timeout was performed.  The area around the umbilicus was infiltrated with quarter percent Marcaine.  A transversely oriented incision was made with a 15 blade knife along the upper edge of the umbilicus.  The incision was carried through the skin and subcutaneous tissue sharply with the electrocautery until the fascia of the abdominal wall was encountered.  The hernia sac was opened.  There was some omental fat within the hernia sac.  I was able to sharply free this from the hernia sac and reduce it through the fascial defect.  The omentum appeared to be adherent to the anterior abdominal wall around the fascial defect on the inside.  The actual fascial defect was very small to the point where I could not get a fingertip through the opening.  At this point I elected to primarily repair the fascial defect since it was so small without using any mesh.  The fascial defect was closed with interrupted #1 Novafil stitches.  The wound was then irrigated with copious amounts of saline.  The umbilicus was  tacked back to the fascia with interrupted 2-0 Vicryl stitches.  The subcutaneous tissue was closed with interrupted 2-0 Vicryl stitches.  The skin was closed with a running 4-0 Monocryl subcuticular stitch.  Dermabond dressings were applied.  The patient tolerated the procedure well.  At the end of the case all needle sponge and instrument counts were correct.  The patient was then awakened and taken to recovery in stable condition.  The patient's hernia defect was approximately 1 cm.  PLAN OF CARE: Discharge to home after PACU  PATIENT DISPOSITION:  PACU - hemodynamically stable.   Delay start of Pharmacological VTE agent (>24hrs) due to surgical blood loss or risk of bleeding: not applicable

## 2024-02-17 NOTE — Anesthesia Postprocedure Evaluation (Signed)
 Anesthesia Post Note  Patient: Shelley Rojas  Procedure(s) Performed: UMBILICAL HERNIA REPAIR     Patient location during evaluation: PACU Anesthesia Type: General Level of consciousness: awake and alert Pain management: pain level controlled Vital Signs Assessment: post-procedure vital signs reviewed and stable Respiratory status: spontaneous breathing, nonlabored ventilation, respiratory function stable and patient connected to nasal cannula oxygen Cardiovascular status: blood pressure returned to baseline and stable Postop Assessment: no apparent nausea or vomiting Anesthetic complications: no   No notable events documented.  Last Vitals:  Vitals:   02/17/24 1230 02/17/24 1300  BP: 122/86 (!) 120/90  Pulse: (!) 55 62  Resp:    Temp:    SpO2: 98% 98%    Last Pain:  Vitals:   02/17/24 1300  TempSrc:   PainSc: 3                  Mariann Barter

## 2024-02-17 NOTE — Anesthesia Preprocedure Evaluation (Signed)
 Anesthesia Evaluation  Patient identified by MRN, date of birth, ID band Patient awake    History of Anesthesia Complications (+) PONV and history of anesthetic complications  Airway Mallampati: II  TM Distance: >3 FB     Dental no notable dental hx. (+) Missing   Pulmonary sleep apnea , neg COPD, Patient abstained from smoking., former smoker   breath sounds clear to auscultation       Cardiovascular hypertension, (-) angina (-) CAD and (-) Past MI  Rhythm:Regular Rate:Normal     Neuro/Psych neg Seizures    GI/Hepatic ,GERD  ,,(+) neg Cirrhosis        Endo/Other    Renal/GU Renal disease     Musculoskeletal   Abdominal   Peds  Hematology   Anesthesia Other Findings   Reproductive/Obstetrics                             Anesthesia Physical Anesthesia Plan  ASA: 2  Anesthesia Plan: General   Post-op Pain Management:    Induction: Intravenous  PONV Risk Score and Plan: 3 and Ondansetron  Airway Management Planned: Oral ETT  Additional Equipment:   Intra-op Plan:   Post-operative Plan: Extubation in OR  Informed Consent: I have reviewed the patients History and Physical, chart, labs and discussed the procedure including the risks, benefits and alternatives for the proposed anesthesia with the patient or authorized representative who has indicated his/her understanding and acceptance.     Dental advisory given  Plan Discussed with:   Anesthesia Plan Comments:        Anesthesia Quick Evaluation

## 2024-02-17 NOTE — H&P (Signed)
 REFERRING PHYSICIAN: Lovie Macadamia, MD PROVIDER: Lindell Noe, MD MRN: [id number] DOB: 07-18-1981 Subjective   Chief Complaint: New Consultation (umbilical hernia)  History of Present Illness: Shelley Rojas is a 43 y.o. female who is seen today as an office consultation for evaluation of New Consultation (umbilical hernia)  We are asked to see the patient in consultation by Dr.Youssefzadeh to evaluate her for an umbilical hernia. The patient is a 43 year old white female who has had a bulge at her umbilicus for the last 4 to 5 years. She did have an episode back in the summertime of 25 where the hernia got stuck and she had to go to the emergency department. They were able to reduce it at that time and she was discharged home. She does have some discomfort associated with it. She has had no more episodes of incarceration. She has had no previous abdominal surgery. She does have some hypertension and vapes  Review of Systems: A complete review of systems was obtained from the patient. I have reviewed this information and discussed as appropriate with the patient. See HPI as well for other ROS.  ROS   Medical History: Past Medical History:  Diagnosis Date  H/O wisdom tooth extraction   Patient Active Problem List  Diagnosis  Umbilical hernia without obstruction or gangrene   History reviewed. No pertinent surgical history.   No Known Allergies  Current Outpatient Medications on File Prior to Visit  Medication Sig Dispense Refill  atorvastatin (LIPITOR) 20 MG tablet Take 20 mg by mouth once daily  losartan-hydroCHLOROthiazide (HYZAAR) 100-25 mg tablet Take 1 tablet by mouth once daily   No current facility-administered medications on file prior to visit.   Family History  Problem Relation Age of Onset  Stroke Father  Skin cancer Father  High blood pressure (Hypertension) Father  Hyperlipidemia (Elevated cholesterol) Father  Coronary Artery Disease (Blocked arteries  around heart) Father  Deep vein thrombosis (DVT or abnormal blood clot formation) Father    Social History   Tobacco Use  Smoking Status Former  Types: Cigarettes  Smokeless Tobacco Not on file    Social History   Socioeconomic History  Marital status: Married  Tobacco Use  Smoking status: Former  Types: Cigarettes  Vaping Use  Vaping status: Every Day  Substance and Sexual Activity  Alcohol use: Yes  Drug use: Never   Social Drivers of Corporate investment banker Strain: Low Risk (09/15/2019)  Received from [facility]  Overall Financial Resource Strain (CARDIA)  Difficulty of Paying Living Expenses: Not hard at all  Food Insecurity: No Food Insecurity (09/15/2019)  Received from [facility]  Hunger Vital Sign  Worried About Running Out of Food in the Last Year: Never true  Ran Out of Food in the Last Year: Never true  Transportation Needs: No Transportation Needs (09/15/2019)  Received from [facility] - Transportation  Lack of Transportation (Medical): No  Lack of Transportation (Non-Medical): No   Objective:   Vitals:  BP: 129/89  Pulse: 82  Temp: 36.8 C (98.2 F)  SpO2: 98%  Weight: 78.5 kg (173 lb)  Height: 175.3 cm (5\' 9" )  PainSc: 0-No pain   Body mass index is 25.55 kg/m.  Physical Exam Vitals reviewed.  Constitutional:  General: She is not in acute distress. Appearance: Normal appearance.  HENT:  Head: Normocephalic and atraumatic.  Right Ear: External ear normal.  Left Ear: External ear normal.  Nose: Nose normal.  Mouth/Throat:  Mouth: Mucous membranes are  moist.  Pharynx: Oropharynx is clear.  Eyes:  General: No scleral icterus. Extraocular Movements: Extraocular movements intact.  Conjunctiva/sclera: Conjunctivae normal.  Pupils: Pupils are equal, round, and reactive to light.  Cardiovascular:  Rate and Rhythm: Normal rate and regular rhythm.  Pulses: Normal pulses.  Heart sounds: Normal heart sounds.  Pulmonary:   Effort: Pulmonary effort is normal. No respiratory distress.  Breath sounds: Normal breath sounds.  Abdominal:  General: Bowel sounds are normal.  Palpations: Abdomen is soft.  Tenderness: There is no abdominal tenderness.  Comments: There is a small to medium sized umbilical hernia that is soft and seems to reduce easily. It is difficult to palpate the exact edges of the fascial defect. I suspect the hernia is roughly 2 to 3 cm  Musculoskeletal:  General: No swelling, tenderness or deformity. Normal range of motion.  Cervical back: Normal range of motion and neck supple.  Skin: General: Skin is warm and dry.  Coloration: Skin is not jaundiced.  Neurological:  General: No focal deficit present.  Mental Status: She is alert and oriented to person, place, and time.  Psychiatric:  Mood and Affect: Mood normal.  Behavior: Behavior normal.     Labs, Imaging and Diagnostic Testing:  Assessment and Plan:   Diagnoses and all orders for this visit:  Umbilical hernia without obstruction or gangrene - [facility] Case Posting Request; Future   The patient appears to have a reducible umbilical hernia. Because of the risk of incarceration and strangulation my recommendation would be to have this fixed. She would also like to have this done. I have discussed with her in detail the risks and benefits of the operation as well as some of the technical aspects including the use of mesh and she understands and wishes to proceed. We will move forward with surgical scheduling.

## 2024-02-17 NOTE — Telephone Encounter (Signed)
 Ultra sound limited  Ultra sound including axilla left breast / also order for the right breast. Appointment mailed to the patient. Will contact patient  later by phone- patient having surgery today.

## 2024-02-18 ENCOUNTER — Encounter (HOSPITAL_COMMUNITY): Payer: Self-pay | Admitting: General Surgery

## 2024-03-10 ENCOUNTER — Encounter: Payer: Medicaid Other | Admitting: Adult Health

## 2024-05-25 ENCOUNTER — Encounter: Payer: Self-pay | Admitting: *Deleted

## 2024-06-02 ENCOUNTER — Encounter: Admitting: Adult Health

## 2024-06-19 ENCOUNTER — Other Ambulatory Visit: Payer: Self-pay | Admitting: Internal Medicine

## 2024-06-19 ENCOUNTER — Ambulatory Visit
Admission: RE | Admit: 2024-06-19 | Discharge: 2024-06-19 | Disposition: A | Discharge status: Home / Self Care | Source: Ambulatory Visit | Attending: Internal Medicine | Admitting: Internal Medicine

## 2024-06-19 DIAGNOSIS — Z0000 Encounter for general adult medical examination without abnormal findings: Secondary | ICD-10-CM

## 2024-06-19 DIAGNOSIS — N63 Unspecified lump in unspecified breast: Secondary | ICD-10-CM
